# Patient Record
Sex: Female | Born: 1945 | Race: White | Hispanic: No | Marital: Single | Smoking: Never smoker
Health system: Southern US, Community
[De-identification: ages and names within clinical notes are randomized; demographics above are authoritative.]

## PROBLEM LIST (undated history)

## (undated) DIAGNOSIS — M1990 Unspecified osteoarthritis, unspecified site: Secondary | ICD-10-CM

## (undated) DIAGNOSIS — R112 Nausea with vomiting, unspecified: Secondary | ICD-10-CM

## (undated) DIAGNOSIS — J189 Pneumonia, unspecified organism: Secondary | ICD-10-CM

## (undated) DIAGNOSIS — M542 Cervicalgia: Secondary | ICD-10-CM

## (undated) DIAGNOSIS — J40 Bronchitis, not specified as acute or chronic: Secondary | ICD-10-CM

## (undated) DIAGNOSIS — M549 Dorsalgia, unspecified: Secondary | ICD-10-CM

## (undated) DIAGNOSIS — IMO0001 Reserved for inherently not codable concepts without codable children: Secondary | ICD-10-CM

## (undated) DIAGNOSIS — C801 Malignant (primary) neoplasm, unspecified: Secondary | ICD-10-CM

## (undated) DIAGNOSIS — D649 Anemia, unspecified: Secondary | ICD-10-CM

## (undated) DIAGNOSIS — M25519 Pain in unspecified shoulder: Secondary | ICD-10-CM

## (undated) DIAGNOSIS — R252 Cramp and spasm: Secondary | ICD-10-CM

## (undated) DIAGNOSIS — N879 Dysplasia of cervix uteri, unspecified: Secondary | ICD-10-CM

## (undated) DIAGNOSIS — I8390 Asymptomatic varicose veins of unspecified lower extremity: Secondary | ICD-10-CM

## (undated) DIAGNOSIS — Z9889 Other specified postprocedural states: Secondary | ICD-10-CM

## (undated) HISTORY — PX: BREAST EXCISIONAL BIOPSY: SUR124

## (undated) HISTORY — PX: AUGMENTATION MAMMAPLASTY: SUR837

## (undated) HISTORY — PX: OTHER SURGICAL HISTORY: SHX169

## (undated) HISTORY — PX: BREAST SURGERY: SHX581

## (undated) HISTORY — PX: VARICOSE VEIN SURGERY: SHX832

## (undated) HISTORY — PX: CHOLECYSTECTOMY: SHX55

---

## 2013-03-06 DIAGNOSIS — R197 Diarrhea, unspecified: Secondary | ICD-10-CM | POA: Diagnosis not present

## 2013-03-06 DIAGNOSIS — K7689 Other specified diseases of liver: Secondary | ICD-10-CM | POA: Diagnosis not present

## 2013-03-06 DIAGNOSIS — R1110 Vomiting, unspecified: Secondary | ICD-10-CM | POA: Diagnosis not present

## 2013-03-06 DIAGNOSIS — R109 Unspecified abdominal pain: Secondary | ICD-10-CM | POA: Diagnosis not present

## 2013-03-06 DIAGNOSIS — K5289 Other specified noninfective gastroenteritis and colitis: Secondary | ICD-10-CM | POA: Diagnosis not present

## 2013-04-28 DIAGNOSIS — J029 Acute pharyngitis, unspecified: Secondary | ICD-10-CM | POA: Diagnosis not present

## 2013-05-07 DIAGNOSIS — L821 Other seborrheic keratosis: Secondary | ICD-10-CM | POA: Diagnosis not present

## 2013-05-07 DIAGNOSIS — L819 Disorder of pigmentation, unspecified: Secondary | ICD-10-CM | POA: Diagnosis not present

## 2013-05-07 DIAGNOSIS — D235 Other benign neoplasm of skin of trunk: Secondary | ICD-10-CM | POA: Diagnosis not present

## 2013-05-07 DIAGNOSIS — L578 Other skin changes due to chronic exposure to nonionizing radiation: Secondary | ICD-10-CM | POA: Diagnosis not present

## 2013-05-07 DIAGNOSIS — L820 Inflamed seborrheic keratosis: Secondary | ICD-10-CM | POA: Diagnosis not present

## 2013-05-07 DIAGNOSIS — D1801 Hemangioma of skin and subcutaneous tissue: Secondary | ICD-10-CM | POA: Diagnosis not present

## 2013-10-12 DIAGNOSIS — R1013 Epigastric pain: Secondary | ICD-10-CM | POA: Diagnosis not present

## 2013-10-12 DIAGNOSIS — K810 Acute cholecystitis: Secondary | ICD-10-CM | POA: Diagnosis not present

## 2013-10-12 DIAGNOSIS — I2510 Atherosclerotic heart disease of native coronary artery without angina pectoris: Secondary | ICD-10-CM | POA: Diagnosis not present

## 2013-10-12 DIAGNOSIS — R1011 Right upper quadrant pain: Secondary | ICD-10-CM | POA: Diagnosis not present

## 2013-10-12 DIAGNOSIS — R0609 Other forms of dyspnea: Secondary | ICD-10-CM | POA: Diagnosis not present

## 2013-10-12 DIAGNOSIS — K219 Gastro-esophageal reflux disease without esophagitis: Secondary | ICD-10-CM | POA: Diagnosis not present

## 2013-10-12 DIAGNOSIS — K824 Cholesterolosis of gallbladder: Secondary | ICD-10-CM | POA: Diagnosis not present

## 2013-10-12 DIAGNOSIS — K811 Chronic cholecystitis: Secondary | ICD-10-CM | POA: Diagnosis not present

## 2013-10-12 DIAGNOSIS — R109 Unspecified abdominal pain: Secondary | ICD-10-CM | POA: Diagnosis not present

## 2013-10-12 DIAGNOSIS — R112 Nausea with vomiting, unspecified: Secondary | ICD-10-CM | POA: Diagnosis not present

## 2013-10-12 DIAGNOSIS — K8020 Calculus of gallbladder without cholecystitis without obstruction: Secondary | ICD-10-CM | POA: Diagnosis not present

## 2013-10-12 DIAGNOSIS — K7689 Other specified diseases of liver: Secondary | ICD-10-CM | POA: Diagnosis not present

## 2013-10-12 DIAGNOSIS — K812 Acute cholecystitis with chronic cholecystitis: Secondary | ICD-10-CM | POA: Diagnosis not present

## 2013-10-13 DIAGNOSIS — K810 Acute cholecystitis: Secondary | ICD-10-CM | POA: Diagnosis not present

## 2013-10-27 DIAGNOSIS — M4600 Spinal enthesopathy, site unspecified: Secondary | ICD-10-CM | POA: Diagnosis not present

## 2013-10-27 DIAGNOSIS — M999 Biomechanical lesion, unspecified: Secondary | ICD-10-CM | POA: Diagnosis not present

## 2013-10-27 DIAGNOSIS — M9981 Other biomechanical lesions of cervical region: Secondary | ICD-10-CM | POA: Diagnosis not present

## 2013-10-27 DIAGNOSIS — M5412 Radiculopathy, cervical region: Secondary | ICD-10-CM | POA: Diagnosis not present

## 2013-10-30 DIAGNOSIS — M4600 Spinal enthesopathy, site unspecified: Secondary | ICD-10-CM | POA: Diagnosis not present

## 2013-10-30 DIAGNOSIS — M999 Biomechanical lesion, unspecified: Secondary | ICD-10-CM | POA: Diagnosis not present

## 2013-10-30 DIAGNOSIS — M9981 Other biomechanical lesions of cervical region: Secondary | ICD-10-CM | POA: Diagnosis not present

## 2013-10-30 DIAGNOSIS — M5412 Radiculopathy, cervical region: Secondary | ICD-10-CM | POA: Diagnosis not present

## 2013-11-02 DIAGNOSIS — M999 Biomechanical lesion, unspecified: Secondary | ICD-10-CM | POA: Diagnosis not present

## 2013-11-02 DIAGNOSIS — M5412 Radiculopathy, cervical region: Secondary | ICD-10-CM | POA: Diagnosis not present

## 2013-11-02 DIAGNOSIS — M9981 Other biomechanical lesions of cervical region: Secondary | ICD-10-CM | POA: Diagnosis not present

## 2013-11-02 DIAGNOSIS — M4600 Spinal enthesopathy, site unspecified: Secondary | ICD-10-CM | POA: Diagnosis not present

## 2013-11-06 DIAGNOSIS — M5412 Radiculopathy, cervical region: Secondary | ICD-10-CM | POA: Diagnosis not present

## 2013-11-06 DIAGNOSIS — M999 Biomechanical lesion, unspecified: Secondary | ICD-10-CM | POA: Diagnosis not present

## 2013-11-06 DIAGNOSIS — M4600 Spinal enthesopathy, site unspecified: Secondary | ICD-10-CM | POA: Diagnosis not present

## 2013-11-06 DIAGNOSIS — M9981 Other biomechanical lesions of cervical region: Secondary | ICD-10-CM | POA: Diagnosis not present

## 2014-01-12 DIAGNOSIS — K120 Recurrent oral aphthae: Secondary | ICD-10-CM | POA: Diagnosis not present

## 2014-01-12 DIAGNOSIS — J069 Acute upper respiratory infection, unspecified: Secondary | ICD-10-CM | POA: Diagnosis not present

## 2014-01-12 DIAGNOSIS — R05 Cough: Secondary | ICD-10-CM | POA: Diagnosis not present

## 2014-01-12 DIAGNOSIS — J029 Acute pharyngitis, unspecified: Secondary | ICD-10-CM | POA: Diagnosis not present

## 2014-04-05 DIAGNOSIS — J329 Chronic sinusitis, unspecified: Secondary | ICD-10-CM | POA: Diagnosis not present

## 2014-04-05 DIAGNOSIS — N390 Urinary tract infection, site not specified: Secondary | ICD-10-CM | POA: Diagnosis not present

## 2014-04-07 DIAGNOSIS — J9801 Acute bronchospasm: Secondary | ICD-10-CM | POA: Diagnosis not present

## 2014-04-07 DIAGNOSIS — R079 Chest pain, unspecified: Secondary | ICD-10-CM | POA: Diagnosis not present

## 2014-04-29 DIAGNOSIS — D229 Melanocytic nevi, unspecified: Secondary | ICD-10-CM | POA: Diagnosis not present

## 2014-04-29 DIAGNOSIS — D485 Neoplasm of uncertain behavior of skin: Secondary | ICD-10-CM | POA: Diagnosis not present

## 2014-04-29 DIAGNOSIS — L814 Other melanin hyperpigmentation: Secondary | ICD-10-CM | POA: Diagnosis not present

## 2014-04-29 DIAGNOSIS — L821 Other seborrheic keratosis: Secondary | ICD-10-CM | POA: Diagnosis not present

## 2014-04-29 DIAGNOSIS — D1801 Hemangioma of skin and subcutaneous tissue: Secondary | ICD-10-CM | POA: Diagnosis not present

## 2014-04-29 DIAGNOSIS — L820 Inflamed seborrheic keratosis: Secondary | ICD-10-CM | POA: Diagnosis not present

## 2014-05-17 DIAGNOSIS — R208 Other disturbances of skin sensation: Secondary | ICD-10-CM | POA: Diagnosis not present

## 2014-05-17 DIAGNOSIS — D2271 Melanocytic nevi of right lower limb, including hip: Secondary | ICD-10-CM | POA: Diagnosis not present

## 2014-05-17 DIAGNOSIS — L821 Other seborrheic keratosis: Secondary | ICD-10-CM | POA: Diagnosis not present

## 2014-06-06 DIAGNOSIS — N390 Urinary tract infection, site not specified: Secondary | ICD-10-CM | POA: Diagnosis not present

## 2014-06-08 DIAGNOSIS — N390 Urinary tract infection, site not specified: Secondary | ICD-10-CM | POA: Diagnosis not present

## 2014-08-27 DIAGNOSIS — R5383 Other fatigue: Secondary | ICD-10-CM | POA: Diagnosis not present

## 2014-08-27 DIAGNOSIS — R634 Abnormal weight loss: Secondary | ICD-10-CM | POA: Diagnosis not present

## 2014-08-27 DIAGNOSIS — R079 Chest pain, unspecified: Secondary | ICD-10-CM | POA: Diagnosis not present

## 2014-08-27 DIAGNOSIS — K5900 Constipation, unspecified: Secondary | ICD-10-CM | POA: Diagnosis not present

## 2014-09-07 DIAGNOSIS — R634 Abnormal weight loss: Secondary | ICD-10-CM | POA: Diagnosis not present

## 2014-09-13 DIAGNOSIS — R531 Weakness: Secondary | ICD-10-CM | POA: Diagnosis not present

## 2014-09-13 DIAGNOSIS — M6281 Muscle weakness (generalized): Secondary | ICD-10-CM | POA: Diagnosis not present

## 2014-09-13 DIAGNOSIS — R42 Dizziness and giddiness: Secondary | ICD-10-CM | POA: Diagnosis not present

## 2014-09-13 DIAGNOSIS — D649 Anemia, unspecified: Secondary | ICD-10-CM | POA: Diagnosis not present

## 2014-09-13 DIAGNOSIS — Z881 Allergy status to other antibiotic agents status: Secondary | ICD-10-CM | POA: Diagnosis not present

## 2014-09-13 DIAGNOSIS — D6489 Other specified anemias: Secondary | ICD-10-CM | POA: Diagnosis not present

## 2014-09-13 DIAGNOSIS — R4182 Altered mental status, unspecified: Secondary | ICD-10-CM | POA: Diagnosis not present

## 2014-09-13 DIAGNOSIS — R5382 Chronic fatigue, unspecified: Secondary | ICD-10-CM | POA: Diagnosis not present

## 2014-10-01 DIAGNOSIS — R5383 Other fatigue: Secondary | ICD-10-CM | POA: Diagnosis not present

## 2014-10-01 DIAGNOSIS — R718 Other abnormality of red blood cells: Secondary | ICD-10-CM | POA: Diagnosis not present

## 2014-10-01 DIAGNOSIS — D649 Anemia, unspecified: Secondary | ICD-10-CM | POA: Diagnosis not present

## 2014-10-08 DIAGNOSIS — R5383 Other fatigue: Secondary | ICD-10-CM | POA: Diagnosis not present

## 2014-10-12 DIAGNOSIS — R0789 Other chest pain: Secondary | ICD-10-CM | POA: Diagnosis not present

## 2014-10-12 DIAGNOSIS — R079 Chest pain, unspecified: Secondary | ICD-10-CM | POA: Diagnosis not present

## 2014-10-29 DIAGNOSIS — E2839 Other primary ovarian failure: Secondary | ICD-10-CM | POA: Diagnosis not present

## 2014-10-29 DIAGNOSIS — R202 Paresthesia of skin: Secondary | ICD-10-CM | POA: Diagnosis not present

## 2014-10-29 DIAGNOSIS — D649 Anemia, unspecified: Secondary | ICD-10-CM | POA: Diagnosis not present

## 2014-10-29 DIAGNOSIS — R252 Cramp and spasm: Secondary | ICD-10-CM | POA: Diagnosis not present

## 2014-10-29 DIAGNOSIS — Z1389 Encounter for screening for other disorder: Secondary | ICD-10-CM | POA: Diagnosis not present

## 2014-10-29 DIAGNOSIS — R5383 Other fatigue: Secondary | ICD-10-CM | POA: Diagnosis not present

## 2014-10-29 DIAGNOSIS — Z0000 Encounter for general adult medical examination without abnormal findings: Secondary | ICD-10-CM | POA: Diagnosis not present

## 2014-11-03 DIAGNOSIS — S335XXA Sprain of ligaments of lumbar spine, initial encounter: Secondary | ICD-10-CM | POA: Diagnosis not present

## 2014-11-03 DIAGNOSIS — M62838 Other muscle spasm: Secondary | ICD-10-CM | POA: Diagnosis not present

## 2014-11-15 DIAGNOSIS — M545 Low back pain: Secondary | ICD-10-CM | POA: Diagnosis not present

## 2014-11-15 DIAGNOSIS — M542 Cervicalgia: Secondary | ICD-10-CM | POA: Diagnosis not present

## 2014-11-17 ENCOUNTER — Other Ambulatory Visit: Payer: Self-pay | Admitting: Gastroenterology

## 2014-11-19 DIAGNOSIS — M542 Cervicalgia: Secondary | ICD-10-CM | POA: Diagnosis not present

## 2014-11-19 DIAGNOSIS — M545 Low back pain: Secondary | ICD-10-CM | POA: Diagnosis not present

## 2014-11-24 DIAGNOSIS — M542 Cervicalgia: Secondary | ICD-10-CM | POA: Diagnosis not present

## 2014-11-24 DIAGNOSIS — M545 Low back pain: Secondary | ICD-10-CM | POA: Diagnosis not present

## 2014-11-26 DIAGNOSIS — M542 Cervicalgia: Secondary | ICD-10-CM | POA: Diagnosis not present

## 2014-11-26 DIAGNOSIS — M545 Low back pain: Secondary | ICD-10-CM | POA: Diagnosis not present

## 2014-11-29 DIAGNOSIS — M545 Low back pain: Secondary | ICD-10-CM | POA: Diagnosis not present

## 2014-11-29 DIAGNOSIS — M542 Cervicalgia: Secondary | ICD-10-CM | POA: Diagnosis not present

## 2014-12-01 DIAGNOSIS — M542 Cervicalgia: Secondary | ICD-10-CM | POA: Diagnosis not present

## 2014-12-01 DIAGNOSIS — M545 Low back pain: Secondary | ICD-10-CM | POA: Diagnosis not present

## 2014-12-06 DIAGNOSIS — M810 Age-related osteoporosis without current pathological fracture: Secondary | ICD-10-CM | POA: Diagnosis not present

## 2014-12-06 DIAGNOSIS — E2839 Other primary ovarian failure: Secondary | ICD-10-CM | POA: Diagnosis not present

## 2014-12-08 DIAGNOSIS — M542 Cervicalgia: Secondary | ICD-10-CM | POA: Diagnosis not present

## 2014-12-08 DIAGNOSIS — M545 Low back pain: Secondary | ICD-10-CM | POA: Diagnosis not present

## 2014-12-13 DIAGNOSIS — M545 Low back pain: Secondary | ICD-10-CM | POA: Diagnosis not present

## 2014-12-13 DIAGNOSIS — M542 Cervicalgia: Secondary | ICD-10-CM | POA: Diagnosis not present

## 2014-12-14 ENCOUNTER — Encounter (HOSPITAL_COMMUNITY): Payer: Self-pay | Admitting: *Deleted

## 2014-12-15 ENCOUNTER — Other Ambulatory Visit: Payer: Self-pay | Admitting: Gastroenterology

## 2014-12-17 DIAGNOSIS — M542 Cervicalgia: Secondary | ICD-10-CM | POA: Diagnosis not present

## 2014-12-17 DIAGNOSIS — M545 Low back pain: Secondary | ICD-10-CM | POA: Diagnosis not present

## 2014-12-20 ENCOUNTER — Emergency Department (HOSPITAL_COMMUNITY)
Admission: EM | Admit: 2014-12-20 | Discharge: 2014-12-20 | Disposition: A | Discharge status: Home / Self Care | Payer: Medicare Other | Attending: Emergency Medicine | Admitting: Emergency Medicine

## 2014-12-20 ENCOUNTER — Other Ambulatory Visit: Payer: Self-pay

## 2014-12-20 ENCOUNTER — Encounter (HOSPITAL_COMMUNITY): Payer: Self-pay

## 2014-12-20 DIAGNOSIS — Z880 Allergy status to penicillin: Secondary | ICD-10-CM | POA: Insufficient documentation

## 2014-12-20 DIAGNOSIS — Z79899 Other long term (current) drug therapy: Secondary | ICD-10-CM | POA: Insufficient documentation

## 2014-12-20 DIAGNOSIS — R109 Unspecified abdominal pain: Secondary | ICD-10-CM

## 2014-12-20 DIAGNOSIS — R61 Generalized hyperhidrosis: Secondary | ICD-10-CM | POA: Diagnosis not present

## 2014-12-20 DIAGNOSIS — R1013 Epigastric pain: Secondary | ICD-10-CM | POA: Diagnosis present

## 2014-12-20 DIAGNOSIS — R079 Chest pain, unspecified: Secondary | ICD-10-CM | POA: Diagnosis not present

## 2014-12-20 LAB — CBC WITH DIFFERENTIAL/PLATELET
Basophils Absolute: 0 K/uL (ref 0.0–0.1)
Basophils Relative: 0 %
Eosinophils Absolute: 0.1 K/uL (ref 0.0–0.7)
Eosinophils Relative: 2 %
HCT: 38.8 % (ref 36.0–46.0)
Hemoglobin: 12.8 g/dL (ref 12.0–15.0)
Lymphocytes Relative: 22 %
Lymphs Abs: 1.1 K/uL (ref 0.7–4.0)
MCH: 29.2 pg (ref 26.0–34.0)
MCHC: 33 g/dL (ref 30.0–36.0)
MCV: 88.4 fL (ref 78.0–100.0)
Monocytes Absolute: 0.6 K/uL (ref 0.1–1.0)
Monocytes Relative: 13 %
Neutro Abs: 3.1 K/uL (ref 1.7–7.7)
Neutrophils Relative %: 63 %
Platelets: 175 K/uL (ref 150–400)
RBC: 4.39 MIL/uL (ref 3.87–5.11)
RDW: 13.7 % (ref 11.5–15.5)
WBC: 4.9 K/uL (ref 4.0–10.5)

## 2014-12-20 LAB — COMPREHENSIVE METABOLIC PANEL WITH GFR
ALT: 14 U/L (ref 14–54)
AST: 25 U/L (ref 15–41)
Albumin: 3.8 g/dL (ref 3.5–5.0)
Alkaline Phosphatase: 61 U/L (ref 38–126)
Anion gap: 7 (ref 5–15)
BUN: 14 mg/dL (ref 6–20)
CO2: 26 mmol/L (ref 22–32)
Calcium: 8.6 mg/dL — ABNORMAL LOW (ref 8.9–10.3)
Chloride: 98 mmol/L — ABNORMAL LOW (ref 101–111)
Creatinine, Ser: 0.56 mg/dL (ref 0.44–1.00)
GFR calc Af Amer: >60 mL/min
GFR calc non Af Amer: >60 mL/min
Glucose, Bld: 105 mg/dL — ABNORMAL HIGH (ref 65–99)
Potassium: 3.8 mmol/L (ref 3.5–5.1)
Sodium: 131 mmol/L — ABNORMAL LOW (ref 135–145)
Total Bilirubin: 1 mg/dL (ref 0.3–1.2)
Total Protein: 6.3 g/dL — ABNORMAL LOW (ref 6.5–8.1)

## 2014-12-20 LAB — URINALYSIS, ROUTINE W REFLEX MICROSCOPIC
Bilirubin Urine: NEGATIVE
Glucose, UA: NEGATIVE mg/dL
Hgb urine dipstick: NEGATIVE
Ketones, ur: NEGATIVE mg/dL
Leukocytes, UA: NEGATIVE
Nitrite: NEGATIVE
Protein, ur: NEGATIVE mg/dL
Specific Gravity, Urine: 1.009 (ref 1.005–1.030)
Urobilinogen, UA: 1 mg/dL (ref 0.0–1.0)
pH: 7 (ref 5.0–8.0)

## 2014-12-20 LAB — LIPASE, BLOOD: Lipase: 27 U/L (ref 22–51)

## 2014-12-20 NOTE — Discharge Instructions (Signed)
Abdominal Pain, Adult Many things can cause abdominal pain. Usually, abdominal pain is not caused by a disease and will improve without treatment. It can often be observed and treated at home. Your health care provider will do a physical exam and possibly order blood tests and X-rays to help determine the seriousness of your pain. However, in many cases, more time must pass before a clear cause of the pain can be found. Before that point, your health care provider may not know if you need more testing or further treatment. HOME CARE INSTRUCTIONS Monitor your abdominal pain for any changes. The following actions may help to alleviate any discomfort you are experiencing:  Only take over-the-counter or prescription medicines as directed by your health care provider.  Do not take laxatives unless directed to do so by your health care provider.  Try a clear liquid diet (broth, tea, or water) as directed by your health care provider. Slowly move to a bland diet as tolerated. SEEK MEDICAL CARE IF:  You have unexplained abdominal pain.  You have abdominal pain associated with nausea or diarrhea.  You have pain when you urinate or have a bowel movement.  You experience abdominal pain that wakes you in the night.  You have abdominal pain that is worsened or improved by eating food.  You have abdominal pain that is worsened with eating fatty foods.  You have a fever. SEEK IMMEDIATE MEDICAL CARE IF:  Your pain does not go away within 2 hours.  You keep throwing up (vomiting).  Your pain is felt only in portions of the abdomen, such as the right side or the left lower portion of the abdomen.  You pass bloody or black tarry stools. MAKE SURE YOU:  Understand these instructions.  Will watch your condition.  Will get help right away if you are not doing well or get worse.   This information is not intended to replace advice given to you by your health care provider. Make sure you discuss  any questions you have with your health care provider.    DO NOT FINISH BOWEL PREP TONIGHT. FOLLOW UP WITH GI DOCTOR IN THE MORNING. RETURN TO THE EMERGENCY DEPARTMENT IF YOU HAVE WORSENING OF YOUR SYMPTOMS, VOMITING, FEVER, BLOOD IN YOUR STOOL.

## 2014-12-20 NOTE — ED Notes (Signed)
Per EMS: Pt scheduled to have colonoscopy tomorrow. Began bowel prep this afternoon. After second glass of bowel prep, began experiencing epigastric and abdominal pain. 10/10. Pt a/o at this time. Denies any vomiting or LOC. CBG = 90, BP = 120/78, 87 BPM.

## 2014-12-21 ENCOUNTER — Encounter (HOSPITAL_COMMUNITY): Payer: Self-pay | Admitting: Anesthesiology

## 2014-12-21 ENCOUNTER — Ambulatory Visit (HOSPITAL_COMMUNITY): Admission: RE | Admit: 2014-12-21 | Payer: Medicare Other | Source: Ambulatory Visit | Admitting: Gastroenterology

## 2014-12-21 ENCOUNTER — Other Ambulatory Visit: Payer: Self-pay | Admitting: Gastroenterology

## 2014-12-21 SURGERY — COLONOSCOPY WITH PROPOFOL
Anesthesia: Monitor Anesthesia Care

## 2014-12-21 MED ORDER — EPHEDRINE SULFATE 50 MG/ML IJ SOLN
INTRAMUSCULAR | Status: AC
  Filled 2014-12-21: qty 1

## 2014-12-21 MED ORDER — PROPOFOL 10 MG/ML IV BOLUS
INTRAVENOUS | Status: AC
  Filled 2014-12-21: qty 20

## 2014-12-21 MED ORDER — ONDANSETRON HCL 4 MG/2ML IJ SOLN
INTRAMUSCULAR | Status: AC
  Filled 2014-12-21: qty 4

## 2014-12-21 MED ORDER — SODIUM CHLORIDE 0.9 % IJ SOLN
INTRAMUSCULAR | Status: AC
  Filled 2014-12-21: qty 10

## 2014-12-22 NOTE — ED Provider Notes (Signed)
CSN: [id number]     Arrival date & time 12/20/14  1826 History   First MD Initiated Contact with Patient 12/20/14 1830     Chief Complaint  Patient presents with  . Abdominal Pain     (Consider location/radiation/quality/duration/timing/severity/associated sxs/prior Treatment) HPI   Brittany Valenzuela is a 69 y.o F with no significant pmhx who presents to the ED today c/o abdominal pain. Pt states that she began her bowel prep for a routine colonoscopy scheduled tomorrow morning. Pt states that she drank 1 glass of her bowel prep when she began to experience sudden onset, severe abdominal pain located in the epigastrium. Pain does not radiate. Pt states that she became very diaphoretic with the pain which lasted 10 minutes before resolving. In the ED pt is symptoms free. Denies vomiting, diarrhea, fever, CP, SOB, syncope.  History reviewed. No pertinent past medical history. Past Surgical History  Procedure Laterality Date  . Cholecystectomy     History reviewed. No pertinent family history. Social History  Substance Use Topics  . Smoking status: Never Smoker   . Smokeless tobacco: None  . Alcohol Use: No   OB History    No data available     Review of Systems  All other systems reviewed and are negative.     Allergies  Anesthetics, amide and Penicillins  Home Medications   Prior to Admission medications   Medication Sig Start Date End Date Taking? Authorizing Provider  ferrous sulfate 325 (65 FE) MG tablet Take 325 mg by mouth daily with breakfast.    Historical Provider, MD  ibuprofen (ADVIL,MOTRIN) 200 MG tablet Take 400 mg by mouth every 6 (six) hours as needed for moderate pain.    Historical Provider, MD  Multiple Vitamin (MULTIVITAMIN WITH MINERALS) TABS tablet Take 1 tablet by mouth daily.    Historical Provider, MD   BP 119/59 mmHg  Pulse 71  Resp 23  SpO2 97% Physical Exam  Constitutional: She is oriented to person, place, and time. She appears  well-developed and well-nourished. No distress.  HENT:  Head: Normocephalic and atraumatic.  Mouth/Throat: No oropharyngeal exudate.  Eyes: Conjunctivae and EOM are normal. Pupils are equal, round, and reactive to light. Right eye exhibits no discharge. Left eye exhibits no discharge. No scleral icterus.  Neck: Normal range of motion. Neck supple.  Cardiovascular: Normal rate, regular rhythm, normal heart sounds and intact distal pulses.  Exam reveals no gallop and no friction rub.   No murmur heard. Pulmonary/Chest: Effort normal and breath sounds normal. No respiratory distress. She has no wheezes. She has no rales. She exhibits no tenderness.  Abdominal: Soft. Bowel sounds are normal. She exhibits no distension and no mass. There is no tenderness. There is no rebound and no guarding.  Musculoskeletal: Normal range of motion. She exhibits no edema.  Lymphadenopathy:    She has no cervical adenopathy.  Neurological: She is alert and oriented to person, place, and time. No cranial nerve deficit.  Strength 5/5 throughout. No sensory deficits.  No gait abnormality   Skin: Skin is warm and dry. No rash noted. She is not diaphoretic. No erythema. No pallor.  Psychiatric: She has a normal mood and affect. Her behavior is normal.  Nursing note and vitals reviewed.   ED Course  Procedures (including critical care time) Labs Review Labs Reviewed  COMPREHENSIVE METABOLIC PANEL - Abnormal; Notable for the following:    Sodium 131 (*)    Chloride 98 (*)    Glucose, Bld 105 (*)  Calcium 8.6 (*)    Total Protein 6.3 (*)    All other components within normal limits  CBC WITH DIFFERENTIAL/PLATELET  LIPASE, BLOOD  URINALYSIS, ROUTINE W REFLEX MICROSCOPIC (NOT AT [facility])    Imaging Review No results found. I have personally reviewed and evaluated these images and lab results as part of my medical decision-making.   EKG Interpretation None      MDM   Final diagnoses:  Abdominal pain,  unspecified abdominal location   Pt presents with sudden onset abdominal pain after initiating bowel prep for routine colonoscopy. Denies vomiting, melena, hematochezia. Abdominal pain lasted for approximately 10. Currently symptom free in ED.  All labs wnl. Pt able to tolerate fluids.No RUQ tenderness, low suspicion gall bladder etiology. This was an isolated incident directly after consuming bowel prep.Suspect this is a gastritis from bowel prep.   Lipase wnl. Do not suspect pancreatitis.    Recommend discontinuation of bowel prep and follow up with GI.   Patient was discussed with and seen by Dr. Lacinda Axon who agrees with the treatment plan.      [name], PA-C 12/22/14 [place], MD 12/24/14 [phone]

## 2014-12-27 DIAGNOSIS — M542 Cervicalgia: Secondary | ICD-10-CM | POA: Diagnosis not present

## 2014-12-27 DIAGNOSIS — M545 Low back pain: Secondary | ICD-10-CM | POA: Diagnosis not present

## 2014-12-29 DIAGNOSIS — M545 Low back pain: Secondary | ICD-10-CM | POA: Diagnosis not present

## 2014-12-29 DIAGNOSIS — M542 Cervicalgia: Secondary | ICD-10-CM | POA: Diagnosis not present

## 2015-01-05 DIAGNOSIS — M545 Low back pain: Secondary | ICD-10-CM | POA: Diagnosis not present

## 2015-01-05 DIAGNOSIS — M542 Cervicalgia: Secondary | ICD-10-CM | POA: Diagnosis not present

## 2015-01-13 DIAGNOSIS — I83893 Varicose veins of bilateral lower extremities with other complications: Secondary | ICD-10-CM | POA: Diagnosis not present

## 2015-01-13 DIAGNOSIS — I83813 Varicose veins of bilateral lower extremities with pain: Secondary | ICD-10-CM | POA: Diagnosis not present

## 2015-01-17 DIAGNOSIS — M545 Low back pain: Secondary | ICD-10-CM | POA: Diagnosis not present

## 2015-01-17 DIAGNOSIS — M542 Cervicalgia: Secondary | ICD-10-CM | POA: Diagnosis not present

## 2015-01-19 DIAGNOSIS — M542 Cervicalgia: Secondary | ICD-10-CM | POA: Diagnosis not present

## 2015-01-19 DIAGNOSIS — M545 Low back pain: Secondary | ICD-10-CM | POA: Diagnosis not present

## 2015-01-20 DIAGNOSIS — I83893 Varicose veins of bilateral lower extremities with other complications: Secondary | ICD-10-CM | POA: Diagnosis not present

## 2015-01-20 DIAGNOSIS — I83813 Varicose veins of bilateral lower extremities with pain: Secondary | ICD-10-CM | POA: Diagnosis not present

## 2015-01-24 DIAGNOSIS — M545 Low back pain: Secondary | ICD-10-CM | POA: Diagnosis not present

## 2015-01-24 DIAGNOSIS — M542 Cervicalgia: Secondary | ICD-10-CM | POA: Diagnosis not present

## 2015-01-31 DIAGNOSIS — M542 Cervicalgia: Secondary | ICD-10-CM | POA: Diagnosis not present

## 2015-01-31 DIAGNOSIS — M545 Low back pain: Secondary | ICD-10-CM | POA: Diagnosis not present

## 2015-02-07 DIAGNOSIS — M545 Low back pain: Secondary | ICD-10-CM | POA: Diagnosis not present

## 2015-02-07 DIAGNOSIS — M542 Cervicalgia: Secondary | ICD-10-CM | POA: Diagnosis not present

## 2015-02-09 DIAGNOSIS — M542 Cervicalgia: Secondary | ICD-10-CM | POA: Diagnosis not present

## 2015-02-09 DIAGNOSIS — M545 Low back pain: Secondary | ICD-10-CM | POA: Diagnosis not present

## 2015-03-09 ENCOUNTER — Encounter (HOSPITAL_COMMUNITY): Payer: Self-pay | Admitting: *Deleted

## 2015-03-10 DIAGNOSIS — I83813 Varicose veins of bilateral lower extremities with pain: Secondary | ICD-10-CM | POA: Diagnosis not present

## 2015-03-10 DIAGNOSIS — I83893 Varicose veins of bilateral lower extremities with other complications: Secondary | ICD-10-CM | POA: Diagnosis not present

## 2015-03-14 ENCOUNTER — Ambulatory Visit (HOSPITAL_COMMUNITY): Payer: Medicare Other | Admitting: Anesthesiology

## 2015-03-14 ENCOUNTER — Ambulatory Visit (HOSPITAL_COMMUNITY)
Admission: RE | Admit: 2015-03-14 | Discharge: 2015-03-14 | Disposition: A | Discharge status: Home / Self Care | Payer: Medicare Other | Source: Ambulatory Visit | Attending: Gastroenterology | Admitting: Gastroenterology

## 2015-03-14 ENCOUNTER — Encounter (HOSPITAL_COMMUNITY): Payer: Self-pay | Admitting: *Deleted

## 2015-03-14 ENCOUNTER — Encounter (HOSPITAL_COMMUNITY)
Admission: RE | Disposition: A | Discharge status: Home / Self Care | Payer: Self-pay | Source: Ambulatory Visit | Attending: Gastroenterology

## 2015-03-14 DIAGNOSIS — Z1211 Encounter for screening for malignant neoplasm of colon: Secondary | ICD-10-CM | POA: Diagnosis not present

## 2015-03-14 DIAGNOSIS — D649 Anemia, unspecified: Secondary | ICD-10-CM | POA: Diagnosis not present

## 2015-03-14 DIAGNOSIS — K2950 Unspecified chronic gastritis without bleeding: Secondary | ICD-10-CM | POA: Insufficient documentation

## 2015-03-14 DIAGNOSIS — D509 Iron deficiency anemia, unspecified: Secondary | ICD-10-CM | POA: Insufficient documentation

## 2015-03-14 HISTORY — DX: Reserved for inherently not codable concepts without codable children: IMO0001

## 2015-03-14 HISTORY — PX: COLONOSCOPY WITH PROPOFOL: SHX5780

## 2015-03-14 HISTORY — DX: Bronchitis, not specified as acute or chronic: J40

## 2015-03-14 HISTORY — DX: Anemia, unspecified: D64.9

## 2015-03-14 HISTORY — PX: ESOPHAGOGASTRODUODENOSCOPY (EGD) WITH PROPOFOL: SHX5813

## 2015-03-14 HISTORY — DX: Pneumonia, unspecified organism: J18.9

## 2015-03-14 SURGERY — COLONOSCOPY WITH PROPOFOL
Anesthesia: Monitor Anesthesia Care

## 2015-03-14 MED ORDER — LACTATED RINGERS IV SOLN
INTRAVENOUS | Status: DC
  Administered 2015-03-14: 1000 mL via INTRAVENOUS

## 2015-03-14 MED ORDER — PROPOFOL 500 MG/50ML IV EMUL
INTRAVENOUS | Status: DC | PRN
  Administered 2015-03-14 (×4): 20 mg via INTRAVENOUS
  Administered 2015-03-14: 40 mg via INTRAVENOUS
  Administered 2015-03-14: 20 mg via INTRAVENOUS

## 2015-03-14 MED ORDER — PROPOFOL 500 MG/50ML IV EMUL
INTRAVENOUS | Status: DC | PRN
  Administered 2015-03-14: 100 ug/kg/min via INTRAVENOUS

## 2015-03-14 MED ORDER — LIDOCAINE HCL (CARDIAC) 20 MG/ML IV SOLN
INTRAVENOUS | Status: AC
Start: 2015-03-14 — End: 2015-03-14
  Filled 2015-03-14: qty 5

## 2015-03-14 MED ORDER — SODIUM CHLORIDE 0.9 % IV SOLN: INTRAVENOUS | Status: DC

## 2015-03-14 MED ORDER — PROPOFOL 10 MG/ML IV BOLUS
INTRAVENOUS | Status: AC
  Filled 2015-03-14: qty 40

## 2015-03-14 MED ORDER — PROPOFOL 10 MG/ML IV BOLUS
INTRAVENOUS | Status: AC
  Filled 2015-03-14: qty 20

## 2015-03-14 SURGICAL SUPPLY — 24 items

## 2015-03-14 NOTE — Op Note (Signed)
Problem: Unexplained iron deficiency anemia on aspirin 81 mg daily. Low hemoglobin, low serum iron saturation, normal serum ferritin (50.7 ng/mL). Negative stool screen for blood on 3 specimens. Normal vitamin B-12 level. No family history of colon cancer. Hemoglobin normalized on oral iron therapy.  Endoscopist: Earle Gell  Premedication: Propofol administered by anesthesia  Procedure: Diagnostic esophagogastroduodenoscopy The patient was placed in the left lateral decubitus position. The Pentax gastroscope was passed through the posterior hypopharynx into the proximal esophagus without difficulty. The hypopharynx, larynx, and vocal cords appeared normal.  Esophagoscopy: The proximal, mid, and lower segments of the esophageal mucosa appears normal. The squamocolumnar junction was regular in appearance and noted at approximately 40 cm from the incisor teeth.  Gastroscopy: Retroflex view of the gastric cardia and fundus was normal. The gastric body, antrum, and pylorus appeared normal. Random biopsies were performed to look for H.pylori gastritis.  Duodenoscopy: The duodenal bulb and descending duodenum appeared normal. Four biopsies were performed from the second portion of the duodenum and a biopsy was performed from the distal duodenal bulb to look for signs of villous atrophy associated with celiac disease.  Assessment: Normal esophagogastroduodenoscopy. Screen for H. pylori gastritis and celiac disease pending  Procedure: Screening colonoscopy Anal inspection and digital rectal exam were normal. The Pentax pediatric colonoscope was introduced into the rectum and advanced to the cecum. A normal-appearing appendiceal orifice and ileocecal valve were identified. Colonic preparation for the exam was satisfactory after vigorous water lavage of the colonic mucosa. Withdrawal time was 21 minutes  Rectum. Normal. Retroflexed view of the distal rectum was normal  Sigmoid colon and descending  colon. Normal  Splenic flexure. Normal  Transverse colon. Normal  Hepatic flexure. Normal  Ascending colon. Normal  Cecum and ileocecal valve. Normal  Assessment: Normal screening colonoscopy.  Recommendation: Remain off aspirin

## 2015-03-14 NOTE — H&P (Signed)
  Problem: Unexplained iron deficiency anemia based on a low hemoglobin, low serum iron saturation, and normal serum ferritin. Negative stool screen for blood on 3 specimens. Normal vitamin B-12 level. No family history of colon cancer. Hemoglobin normalized on oral iron therapy.  History: The patient is a 70 year old female born 04-Jan-1946. She has never undergone screening colonoscopy for colorectal neoplasia in the past.  On 08/27/2014 she was diagnosed with iron deficiency anemia unassociated with the use of chronic nonsteroidal anti-inflammatory medication. She does not donate blood to the [employer]. She received oral iron therapy with normalization of her hemoglobin.  She denies hemoptysis, hematuria, or gastrointestinal bleeding. She underwent a cholecystectomy 1 year ago.  The patient is scheduled to undergo diagnostic esophagogastroduodenoscopy and colonoscopy to evaluate unexplained iron deficiency anemia.  Medication allergies: Sulfa drugs. Penicillin. Doxycycline.  Past medical history: Laparoscopic cholecystectomy for gallstones.  Exam: The patient is alert and lying comfortably on the endoscopy stretcher. Abdomen is soft and nontender to palpation. Cardiac exam reveals a regular rhythm. Lungs are clear to auscultation.  Plan: Proceed with diagnostic esophagogastroduodenoscopy and colonoscopy to evaluate iron deficiency anemia which corrected on oral iron therapy.

## 2015-03-14 NOTE — Anesthesia Preprocedure Evaluation (Addendum)

## 2015-03-14 NOTE — Transfer of Care (Signed)
Immediate Anesthesia Transfer of Care Note  Patient: Brittany Valenzuela  Procedure(s) Performed: Procedure(s): COLONOSCOPY WITH PROPOFOL (N/A) ESOPHAGOGASTRODUODENOSCOPY (EGD) WITH PROPOFOL (N/A)  Patient Location: PACU  Anesthesia Type:MAC  Level of Consciousness: awake, alert  and oriented  Airway & Oxygen Therapy: Patient Spontanous Breathing and Patient connected to nasal cannula oxygen  Post-op Assessment: Report given to RN and Post -op Vital signs reviewed and stable  Post vital signs: Reviewed and stable  Last Vitals:  Filed Vitals:   03/14/15 0757  BP: 119/47  Temp: 36.7 C  Resp: 14    Complications: No apparent anesthesia complications

## 2015-03-14 NOTE — Discharge Instructions (Signed)
Colonoscopy, Care After °Refer to this sheet in the next few weeks. These instructions provide you with information on caring for yourself after your procedure. Your health care provider may also give you more specific instructions. Your treatment has been planned according to current medical practices, but problems sometimes occur. Call your health care provider if you have any problems or questions after your procedure. °WHAT TO EXPECT AFTER THE PROCEDURE  °After your procedure, it is typical to have the following: °· A small amount of blood in your stool. °· Moderate amounts of gas and mild abdominal cramping or bloating. °HOME CARE INSTRUCTIONS °· Do not drive, operate machinery, or sign important documents for 24 hours. °· You may shower and resume your regular physical activities, but move at a slower pace for the first 24 hours. °· Take frequent rest periods for the first 24 hours. °· Walk around or put a warm pack on your abdomen to help reduce abdominal cramping and bloating. °· Drink enough fluids to keep your urine clear or pale yellow. °· You may resume your normal diet as instructed by your health care provider. Avoid heavy or fried foods that are hard to digest. °· Avoid drinking alcohol for 24 hours or as instructed by your health care provider. °· Only take over-the-counter or prescription medicines as directed by your health care provider. °· If a tissue sample (biopsy) was taken during your procedure: °¨ Do not take aspirin or blood thinners for 7 days, or as instructed by your health care provider. °¨ Do not drink alcohol for 7 days, or as instructed by your health care provider. °¨ Eat soft foods for the first 24 hours. °SEEK MEDICAL CARE IF: °You have persistent spotting of blood in your stool 2-3 days after the procedure. °SEEK IMMEDIATE MEDICAL CARE IF: °· You have more than a small spotting of blood in your stool. °· You pass large blood clots in your stool. °· Your abdomen is swollen  (distended). °· You have nausea or vomiting. °· You have a fever. °· You have increasing abdominal pain that is not relieved with medicine. °  °This information is not intended to replace advice given to you by your health care provider. Make sure you discuss any questions you have with your health care provider. °  °Document Released: 10/04/2003 Document Revised: 12/10/2012 Document Reviewed: 10/27/2012 °Elsevier Interactive Patient Education ©2016 [employer] ° °

## 2015-03-14 NOTE — Anesthesia Postprocedure Evaluation (Signed)
Anesthesia Post Note  Patient: Brittany Valenzuela  Procedure(s) Performed: Procedure(s) (LRB): COLONOSCOPY WITH PROPOFOL (N/A) ESOPHAGOGASTRODUODENOSCOPY (EGD) WITH PROPOFOL (N/A)  Patient location during evaluation: PACU Anesthesia Type: MAC Level of consciousness: awake and alert Pain management: pain level controlled Vital Signs Assessment: post-procedure vital signs reviewed and stable Respiratory status: spontaneous breathing, nonlabored ventilation, respiratory function stable and patient connected to nasal cannula oxygen Cardiovascular status: stable and blood pressure returned to baseline Anesthetic complications: no    Last Vitals:  Filed Vitals:   03/14/15 0932 03/14/15 0959  BP: 102/44 138/55  Temp: 36.4 C   Resp: 16 14    Last Pain: There were no vitals filed for this visit.               Riccardo Dubin

## 2015-03-15 ENCOUNTER — Encounter (HOSPITAL_COMMUNITY): Payer: Self-pay | Admitting: Gastroenterology

## 2015-03-23 DIAGNOSIS — M545 Low back pain: Secondary | ICD-10-CM | POA: Diagnosis not present

## 2015-03-23 DIAGNOSIS — M542 Cervicalgia: Secondary | ICD-10-CM | POA: Diagnosis not present

## 2015-03-28 DIAGNOSIS — M542 Cervicalgia: Secondary | ICD-10-CM | POA: Diagnosis not present

## 2015-03-28 DIAGNOSIS — M545 Low back pain: Secondary | ICD-10-CM | POA: Diagnosis not present

## 2015-03-30 DIAGNOSIS — I8312 Varicose veins of left lower extremity with inflammation: Secondary | ICD-10-CM | POA: Diagnosis not present

## 2015-03-30 DIAGNOSIS — I83892 Varicose veins of left lower extremities with other complications: Secondary | ICD-10-CM | POA: Diagnosis not present

## 2015-04-01 DIAGNOSIS — I83892 Varicose veins of left lower extremities with other complications: Secondary | ICD-10-CM | POA: Diagnosis not present

## 2015-04-01 DIAGNOSIS — I8312 Varicose veins of left lower extremity with inflammation: Secondary | ICD-10-CM | POA: Diagnosis not present

## 2015-04-06 DIAGNOSIS — M542 Cervicalgia: Secondary | ICD-10-CM | POA: Diagnosis not present

## 2015-04-06 DIAGNOSIS — M545 Low back pain: Secondary | ICD-10-CM | POA: Diagnosis not present

## 2015-04-14 DIAGNOSIS — I83812 Varicose veins of left lower extremities with pain: Secondary | ICD-10-CM | POA: Diagnosis not present

## 2015-04-14 DIAGNOSIS — I8312 Varicose veins of left lower extremity with inflammation: Secondary | ICD-10-CM | POA: Diagnosis not present

## 2015-04-18 DIAGNOSIS — I83812 Varicose veins of left lower extremities with pain: Secondary | ICD-10-CM | POA: Diagnosis not present

## 2015-04-20 DIAGNOSIS — M542 Cervicalgia: Secondary | ICD-10-CM | POA: Diagnosis not present

## 2015-04-20 DIAGNOSIS — M545 Low back pain: Secondary | ICD-10-CM | POA: Diagnosis not present

## 2015-04-27 DIAGNOSIS — M542 Cervicalgia: Secondary | ICD-10-CM | POA: Diagnosis not present

## 2015-04-27 DIAGNOSIS — M545 Low back pain: Secondary | ICD-10-CM | POA: Diagnosis not present

## 2015-04-29 ENCOUNTER — Other Ambulatory Visit: Payer: Self-pay | Admitting: Sports Medicine

## 2015-04-29 DIAGNOSIS — M542 Cervicalgia: Secondary | ICD-10-CM

## 2015-04-29 DIAGNOSIS — S139XXA Sprain of joints and ligaments of unspecified parts of neck, initial encounter: Secondary | ICD-10-CM | POA: Diagnosis not present

## 2015-04-29 DIAGNOSIS — M545 Low back pain: Secondary | ICD-10-CM | POA: Diagnosis not present

## 2015-05-08 ENCOUNTER — Other Ambulatory Visit: Payer: Medicare Other

## 2015-05-15 ENCOUNTER — Ambulatory Visit
Admission: RE | Admit: 2015-05-15 | Discharge: 2015-05-15 | Disposition: A | Discharge status: Home / Self Care | Payer: Medicare Other | Source: Ambulatory Visit | Attending: Sports Medicine | Admitting: Sports Medicine

## 2015-05-15 DIAGNOSIS — M542 Cervicalgia: Secondary | ICD-10-CM

## 2015-05-15 DIAGNOSIS — M47812 Spondylosis without myelopathy or radiculopathy, cervical region: Secondary | ICD-10-CM | POA: Diagnosis not present

## 2015-05-15 DIAGNOSIS — M545 Low back pain: Secondary | ICD-10-CM

## 2015-05-15 DIAGNOSIS — M47816 Spondylosis without myelopathy or radiculopathy, lumbar region: Secondary | ICD-10-CM | POA: Diagnosis not present

## 2015-05-17 ENCOUNTER — Other Ambulatory Visit: Payer: Self-pay | Admitting: Sports Medicine

## 2015-05-17 DIAGNOSIS — M899 Disorder of bone, unspecified: Secondary | ICD-10-CM

## 2015-05-18 ENCOUNTER — Other Ambulatory Visit: Payer: Medicare Other

## 2015-05-18 DIAGNOSIS — Z01419 Encounter for gynecological examination (general) (routine) without abnormal findings: Secondary | ICD-10-CM | POA: Diagnosis not present

## 2015-05-18 DIAGNOSIS — Z6824 Body mass index (BMI) 24.0-24.9, adult: Secondary | ICD-10-CM | POA: Diagnosis not present

## 2015-05-18 DIAGNOSIS — M810 Age-related osteoporosis without current pathological fracture: Secondary | ICD-10-CM | POA: Diagnosis not present

## 2015-05-18 DIAGNOSIS — Z1231 Encounter for screening mammogram for malignant neoplasm of breast: Secondary | ICD-10-CM | POA: Diagnosis not present

## 2015-05-18 DIAGNOSIS — N814 Uterovaginal prolapse, unspecified: Secondary | ICD-10-CM | POA: Diagnosis not present

## 2015-05-18 DIAGNOSIS — Z124 Encounter for screening for malignant neoplasm of cervix: Secondary | ICD-10-CM | POA: Diagnosis not present

## 2015-05-30 DIAGNOSIS — M7981 Nontraumatic hematoma of soft tissue: Secondary | ICD-10-CM | POA: Diagnosis not present

## 2015-05-30 DIAGNOSIS — I83891 Varicose veins of right lower extremities with other complications: Secondary | ICD-10-CM | POA: Diagnosis not present

## 2015-05-30 DIAGNOSIS — I83811 Varicose veins of right lower extremities with pain: Secondary | ICD-10-CM | POA: Diagnosis not present

## 2015-06-01 DIAGNOSIS — I8311 Varicose veins of right lower extremity with inflammation: Secondary | ICD-10-CM | POA: Diagnosis not present

## 2015-06-09 ENCOUNTER — Ambulatory Visit
Admission: RE | Admit: 2015-06-09 | Discharge: 2015-06-09 | Disposition: A | Discharge status: Home / Self Care | Payer: Medicare Other | Source: Ambulatory Visit | Attending: Sports Medicine | Admitting: Sports Medicine

## 2015-06-09 DIAGNOSIS — M899 Disorder of bone, unspecified: Secondary | ICD-10-CM

## 2015-06-10 DIAGNOSIS — I83811 Varicose veins of right lower extremities with pain: Secondary | ICD-10-CM | POA: Diagnosis not present

## 2015-06-10 DIAGNOSIS — I8311 Varicose veins of right lower extremity with inflammation: Secondary | ICD-10-CM | POA: Diagnosis not present

## 2015-06-14 DIAGNOSIS — I8312 Varicose veins of left lower extremity with inflammation: Secondary | ICD-10-CM | POA: Diagnosis not present

## 2015-06-14 DIAGNOSIS — I83812 Varicose veins of left lower extremities with pain: Secondary | ICD-10-CM | POA: Diagnosis not present

## 2015-06-22 DIAGNOSIS — M545 Low back pain: Secondary | ICD-10-CM | POA: Diagnosis not present

## 2015-06-22 DIAGNOSIS — M542 Cervicalgia: Secondary | ICD-10-CM | POA: Diagnosis not present

## 2015-06-27 DIAGNOSIS — I8311 Varicose veins of right lower extremity with inflammation: Secondary | ICD-10-CM | POA: Diagnosis not present

## 2015-06-27 DIAGNOSIS — I83811 Varicose veins of right lower extremities with pain: Secondary | ICD-10-CM | POA: Diagnosis not present

## 2015-07-01 DIAGNOSIS — R319 Hematuria, unspecified: Secondary | ICD-10-CM | POA: Diagnosis not present

## 2015-07-01 DIAGNOSIS — Z01411 Encounter for gynecological examination (general) (routine) with abnormal findings: Secondary | ICD-10-CM | POA: Diagnosis not present

## 2015-07-01 DIAGNOSIS — N393 Stress incontinence (female) (male): Secondary | ICD-10-CM | POA: Diagnosis not present

## 2015-07-01 DIAGNOSIS — N814 Uterovaginal prolapse, unspecified: Secondary | ICD-10-CM | POA: Diagnosis not present

## 2015-07-04 DIAGNOSIS — N814 Uterovaginal prolapse, unspecified: Secondary | ICD-10-CM | POA: Diagnosis not present

## 2015-07-04 DIAGNOSIS — Z01411 Encounter for gynecological examination (general) (routine) with abnormal findings: Secondary | ICD-10-CM | POA: Diagnosis not present

## 2015-07-06 DIAGNOSIS — N814 Uterovaginal prolapse, unspecified: Secondary | ICD-10-CM | POA: Diagnosis not present

## 2015-07-06 DIAGNOSIS — Z01411 Encounter for gynecological examination (general) (routine) with abnormal findings: Secondary | ICD-10-CM | POA: Diagnosis not present

## 2015-07-06 DIAGNOSIS — M7981 Nontraumatic hematoma of soft tissue: Secondary | ICD-10-CM | POA: Diagnosis not present

## 2015-07-11 DIAGNOSIS — M542 Cervicalgia: Secondary | ICD-10-CM | POA: Diagnosis not present

## 2015-07-11 DIAGNOSIS — M545 Low back pain: Secondary | ICD-10-CM | POA: Diagnosis not present

## 2015-07-21 DIAGNOSIS — D229 Melanocytic nevi, unspecified: Secondary | ICD-10-CM | POA: Diagnosis not present

## 2015-07-21 DIAGNOSIS — D485 Neoplasm of uncertain behavior of skin: Secondary | ICD-10-CM | POA: Diagnosis not present

## 2015-07-21 DIAGNOSIS — D045 Carcinoma in situ of skin of trunk: Secondary | ICD-10-CM | POA: Diagnosis not present

## 2015-07-21 DIAGNOSIS — L814 Other melanin hyperpigmentation: Secondary | ICD-10-CM | POA: Diagnosis not present

## 2015-07-21 DIAGNOSIS — L821 Other seborrheic keratosis: Secondary | ICD-10-CM | POA: Diagnosis not present

## 2015-07-21 DIAGNOSIS — D1801 Hemangioma of skin and subcutaneous tissue: Secondary | ICD-10-CM | POA: Diagnosis not present

## 2015-08-12 DIAGNOSIS — M545 Low back pain: Secondary | ICD-10-CM | POA: Diagnosis not present

## 2015-08-29 ENCOUNTER — Other Ambulatory Visit: Payer: Self-pay | Admitting: Obstetrics and Gynecology

## 2015-09-14 NOTE — Patient Instructions (Signed)
Your procedure is scheduled on:  Wednesday, September 21, 2015  Enter through the [employer] [facility] at:  7:00 AM  Pick up the phone at the desk and dial [phone].  Call this number if you have problems the morning of surgery: [phone].  Remember: Do NOT eat food or drink after: Midnight Tuesday  Take these medicines the morning of surgery with a SIP OF WATER:  None  Do NOT wear jewelry (body piercing), metal hair clips/bobby pins, make-up, or nail polish. Do NOT wear lotions, powders, or perfumes.  You may wear deodorant. Do NOT shave for 48 hours prior to surgery. Do NOT bring valuables to the hospital. Contacts, dentures, or bridgework may not be worn into surgery.  Leave suitcase in car.  After surgery it may be brought to your room.  For patients admitted to the hospital, checkout time is 11:00 AM the day of discharge.

## 2015-09-15 ENCOUNTER — Encounter (HOSPITAL_COMMUNITY)
Admission: RE | Admit: 2015-09-15 | Discharge: 2015-09-15 | Disposition: A | Discharge status: Home / Self Care | Payer: Medicare Other | Source: Ambulatory Visit | Attending: Obstetrics and Gynecology | Admitting: Obstetrics and Gynecology

## 2015-09-15 ENCOUNTER — Encounter (HOSPITAL_COMMUNITY): Payer: Self-pay

## 2015-09-15 DIAGNOSIS — Z01812 Encounter for preprocedural laboratory examination: Secondary | ICD-10-CM | POA: Insufficient documentation

## 2015-09-15 DIAGNOSIS — M1990 Unspecified osteoarthritis, unspecified site: Secondary | ICD-10-CM | POA: Diagnosis not present

## 2015-09-15 DIAGNOSIS — D649 Anemia, unspecified: Secondary | ICD-10-CM | POA: Diagnosis not present

## 2015-09-15 DIAGNOSIS — I868 Varicose veins of other specified sites: Secondary | ICD-10-CM | POA: Diagnosis not present

## 2015-09-15 HISTORY — DX: Cervicalgia: M54.2

## 2015-09-15 HISTORY — DX: Unspecified osteoarthritis, unspecified site: M19.90

## 2015-09-15 HISTORY — DX: Dysplasia of cervix uteri, unspecified: N87.9

## 2015-09-15 HISTORY — DX: Cramp and spasm: R25.2

## 2015-09-15 HISTORY — DX: Dorsalgia, unspecified: M54.9

## 2015-09-15 HISTORY — DX: Other specified postprocedural states: Z98.890

## 2015-09-15 HISTORY — DX: Asymptomatic varicose veins of unspecified lower extremity: I83.90

## 2015-09-15 HISTORY — DX: Nausea with vomiting, unspecified: R11.2

## 2015-09-15 HISTORY — DX: Malignant (primary) neoplasm, unspecified: C80.1

## 2015-09-15 HISTORY — DX: Pain in unspecified shoulder: M25.519

## 2015-09-15 LAB — TYPE AND SCREEN
ABO/RH(D): A NEG
Antibody Screen: NEGATIVE

## 2015-09-15 LAB — CBC
HEMATOCRIT: 34.5 % — AB (ref 36.0–46.0)
Hemoglobin: 12.2 g/dL (ref 12.0–15.0)
MCH: 38.7 pg — AB (ref 26.0–34.0)
MCHC: 35.4 g/dL (ref 30.0–36.0)
MCV: 109.5 fL — AB (ref 78.0–100.0)
PLATELETS: 210 10*3/uL (ref 150–400)
RBC: 3.15 MIL/uL — AB (ref 3.87–5.11)
RDW: 14.7 % (ref 11.5–15.5)
WBC: 4.7 10*3/uL (ref 4.0–10.5)

## 2015-09-15 LAB — BASIC METABOLIC PANEL
Anion gap: 5 (ref 5–15)
BUN: 25 mg/dL — AB (ref 6–20)
CHLORIDE: 103 mmol/L (ref 101–111)
CO2: 29 mmol/L (ref 22–32)
CREATININE: 0.57 mg/dL (ref 0.44–1.00)
Calcium: 9.1 mg/dL (ref 8.9–10.3)
GFR calc Af Amer: >60 mL/min (ref >60)
GFR calc non Af Amer: >60 mL/min (ref >60)
Glucose, Bld: 89 mg/dL (ref 65–99)
POTASSIUM: 4.5 mmol/L (ref 3.5–5.1)
Sodium: 137 mmol/L (ref 135–145)

## 2015-09-15 LAB — ABO/RH: ABO/RH(D): A NEG

## 2015-09-19 MED ORDER — SCOPOLAMINE 1 MG/3DAYS TD PT72
1.0000 | MEDICATED_PATCH | Freq: Once | TRANSDERMAL | Status: DC
Start: 2015-09-19 — End: 2015-09-19

## 2015-09-20 MED ORDER — CIPROFLOXACIN IN D5W 400 MG/200ML IV SOLN
400.0000 mg | INTRAVENOUS | Status: AC
  Administered 2015-09-21: 400 mg via INTRAVENOUS
  Filled 2015-09-20: qty 200

## 2015-09-20 MED ORDER — CLINDAMYCIN PHOSPHATE 900 MG/50ML IV SOLN
900.0000 mg | INTRAVENOUS | Status: AC
  Administered 2015-09-21: 900 mg via INTRAVENOUS
  Filled 2015-09-20: qty 50
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
## 2015-09-21 ENCOUNTER — Inpatient Hospital Stay (HOSPITAL_COMMUNITY)
Admission: RE | Admit: 2015-09-21 | Discharge: 2015-09-24 | DRG: 743 | Disposition: A | Discharge status: Home / Self Care | Payer: Medicare Other | Source: Ambulatory Visit | Attending: Obstetrics and Gynecology | Admitting: Obstetrics and Gynecology

## 2015-09-21 ENCOUNTER — Ambulatory Visit (HOSPITAL_COMMUNITY): Payer: Medicare Other | Admitting: Anesthesiology

## 2015-09-21 ENCOUNTER — Encounter (HOSPITAL_COMMUNITY)
Admission: RE | Disposition: A | Discharge status: Home / Self Care | Payer: Self-pay | Source: Ambulatory Visit | Attending: Obstetrics and Gynecology

## 2015-09-21 ENCOUNTER — Encounter (HOSPITAL_COMMUNITY): Payer: Self-pay | Admitting: Anesthesiology

## 2015-09-21 DIAGNOSIS — Z833 Family history of diabetes mellitus: Secondary | ICD-10-CM

## 2015-09-21 DIAGNOSIS — N813 Complete uterovaginal prolapse: Secondary | ICD-10-CM | POA: Diagnosis not present

## 2015-09-21 DIAGNOSIS — N814 Uterovaginal prolapse, unspecified: Secondary | ICD-10-CM | POA: Diagnosis not present

## 2015-09-21 DIAGNOSIS — Z882 Allergy status to sulfonamides status: Secondary | ICD-10-CM

## 2015-09-21 DIAGNOSIS — M542 Cervicalgia: Secondary | ICD-10-CM | POA: Diagnosis not present

## 2015-09-21 DIAGNOSIS — N819 Female genital prolapse, unspecified: Secondary | ICD-10-CM | POA: Diagnosis present

## 2015-09-21 DIAGNOSIS — Z885 Allergy status to narcotic agent status: Secondary | ICD-10-CM | POA: Diagnosis not present

## 2015-09-21 DIAGNOSIS — Z8249 Family history of ischemic heart disease and other diseases of the circulatory system: Secondary | ICD-10-CM | POA: Diagnosis not present

## 2015-09-21 DIAGNOSIS — N8189 Other female genital prolapse: Secondary | ICD-10-CM | POA: Diagnosis not present

## 2015-09-21 DIAGNOSIS — N393 Stress incontinence (female) (male): Secondary | ICD-10-CM | POA: Diagnosis present

## 2015-09-21 DIAGNOSIS — N72 Inflammatory disease of cervix uteri: Secondary | ICD-10-CM | POA: Diagnosis not present

## 2015-09-21 DIAGNOSIS — Z880 Allergy status to penicillin: Secondary | ICD-10-CM

## 2015-09-21 DIAGNOSIS — M1990 Unspecified osteoarthritis, unspecified site: Secondary | ICD-10-CM | POA: Diagnosis not present

## 2015-09-21 DIAGNOSIS — M549 Dorsalgia, unspecified: Secondary | ICD-10-CM | POA: Diagnosis not present

## 2015-09-21 DIAGNOSIS — Z87410 Personal history of cervical dysplasia: Secondary | ICD-10-CM | POA: Diagnosis not present

## 2015-09-21 HISTORY — PX: BLADDER SUSPENSION: SHX72

## 2015-09-21 HISTORY — PX: ANTERIOR AND POSTERIOR REPAIR: SHX5121

## 2015-09-21 HISTORY — PX: VAGINAL HYSTERECTOMY: SHX2639

## 2015-09-21 HISTORY — PX: CYSTOSCOPY: SHX5120

## 2015-09-21 SURGERY — HYSTERECTOMY, VAGINAL
Anesthesia: General | Site: Vagina

## 2015-09-21 MED ORDER — VASOPRESSIN 20 UNIT/ML IV SOLN
INTRAVENOUS | Status: AC
  Filled 2015-09-21: qty 1

## 2015-09-21 MED ORDER — PROMETHAZINE HCL 25 MG/ML IJ SOLN
6.2500 mg | INTRAMUSCULAR | Status: DC | PRN
  Administered 2015-09-21: 6.25 mg via INTRAVENOUS

## 2015-09-21 MED ORDER — LIDOCAINE HCL (CARDIAC) 20 MG/ML IV SOLN
INTRAVENOUS | Status: DC | PRN
Start: 2015-09-21 — End: 2015-09-21
  Administered 2015-09-21: 100 mg via INTRAVENOUS

## 2015-09-21 MED ORDER — LACTATED RINGERS IV SOLN
INTRAVENOUS | Status: DC
  Administered 2015-09-21 (×3): via INTRAVENOUS

## 2015-09-21 MED ORDER — DEXAMETHASONE SODIUM PHOSPHATE 10 MG/ML IJ SOLN
INTRAMUSCULAR | Status: AC
  Filled 2015-09-21: qty 1

## 2015-09-21 MED ORDER — HYDROMORPHONE HCL 1 MG/ML IJ SOLN
INTRAMUSCULAR | Status: AC
  Filled 2015-09-21: qty 1

## 2015-09-21 MED ORDER — IBUPROFEN 600 MG PO TABS
600.0000 mg | ORAL_TABLET | Freq: Four times a day (QID) | ORAL | Status: DC | PRN
  Administered 2015-09-22 – 2015-09-23 (×3): 600 mg via ORAL
  Filled 2015-09-21 (×4): qty 1

## 2015-09-21 MED ORDER — ESTRADIOL 0.1 MG/GM VA CREA
TOPICAL_CREAM | VAGINAL | Status: DC | PRN
Start: 2015-09-21 — End: 2015-09-21
  Administered 2015-09-21: 1 via VAGINAL

## 2015-09-21 MED ORDER — PNEUMOCOCCAL VAC POLYVALENT 25 MCG/0.5ML IJ INJ
0.5000 mL | INJECTION | INTRAMUSCULAR | Status: DC
  Filled 2015-09-21: qty 0.5

## 2015-09-21 MED ORDER — HYDROMORPHONE HCL 2 MG PO TABS
1.0000 mg | ORAL_TABLET | Freq: Four times a day (QID) | ORAL | Status: DC | PRN
  Administered 2015-09-21 – 2015-09-24 (×9): 1 mg via ORAL
  Filled 2015-09-21 (×9): qty 1

## 2015-09-21 MED ORDER — ONDANSETRON HCL 4 MG/2ML IJ SOLN
4.0000 mg | Freq: Four times a day (QID) | INTRAMUSCULAR | Status: DC | PRN
  Administered 2015-09-21: 4 mg via INTRAVENOUS
  Filled 2015-09-21: qty 2

## 2015-09-21 MED ORDER — ONDANSETRON HCL 4 MG/2ML IJ SOLN
INTRAMUSCULAR | Status: DC | PRN
  Administered 2015-09-21: 4 mg via INTRAVENOUS

## 2015-09-21 MED ORDER — MIDAZOLAM HCL 2 MG/2ML IJ SOLN
INTRAMUSCULAR | Status: AC
  Filled 2015-09-21: qty 2

## 2015-09-21 MED ORDER — STERILE WATER FOR IRRIGATION IR SOLN
Status: DC | PRN
  Administered 2015-09-21: 1000 mL via INTRAVESICAL

## 2015-09-21 MED ORDER — KETOROLAC TROMETHAMINE 30 MG/ML IJ SOLN
30.0000 mg | Freq: Four times a day (QID) | INTRAMUSCULAR | Status: DC
  Administered 2015-09-21 – 2015-09-22 (×3): 30 mg via INTRAVENOUS
  Filled 2015-09-21 (×3): qty 1

## 2015-09-21 MED ORDER — ONDANSETRON HCL 4 MG/2ML IJ SOLN
INTRAMUSCULAR | Status: AC
  Filled 2015-09-21: qty 2

## 2015-09-21 MED ORDER — HYDROMORPHONE HCL 1 MG/ML IJ SOLN
0.2500 mg | INTRAMUSCULAR | Status: DC | PRN
  Administered 2015-09-21: 0.5 mg via INTRAVENOUS

## 2015-09-21 MED ORDER — VASOPRESSIN 20 UNIT/ML IV SOLN
INTRAVENOUS | Status: DC | PRN
  Administered 2015-09-21: 20 mL via INTRAMUSCULAR
  Administered 2015-09-21: 6 mL via INTRAMUSCULAR
  Administered 2015-09-21: 7 mL via INTRAMUSCULAR
  Administered 2015-09-21: 10 mL via INTRAMUSCULAR

## 2015-09-21 MED ORDER — MENTHOL 3 MG MT LOZG: 1.0000 | LOZENGE | OROMUCOSAL | Status: DC | PRN

## 2015-09-21 MED ORDER — SIMETHICONE 80 MG PO CHEW: 80.0000 mg | CHEWABLE_TABLET | Freq: Four times a day (QID) | ORAL | Status: DC | PRN

## 2015-09-21 MED ORDER — HYDROCODONE-ACETAMINOPHEN 5-325 MG PO TABS
2.0000 | ORAL_TABLET | ORAL | Status: DC | PRN
  Administered 2015-09-21 – 2015-09-22 (×3): 1 via ORAL
  Filled 2015-09-21 (×3): qty 2

## 2015-09-21 MED ORDER — MIDAZOLAM HCL 5 MG/5ML IJ SOLN
INTRAMUSCULAR | Status: DC | PRN
  Administered 2015-09-21: 2 mg via INTRAVENOUS

## 2015-09-21 MED ORDER — PROPOFOL 10 MG/ML IV BOLUS
INTRAVENOUS | Status: AC
  Filled 2015-09-21: qty 20

## 2015-09-21 MED ORDER — LIDOCAINE-EPINEPHRINE (PF) 1 %-1:200000 IJ SOLN
INTRAMUSCULAR | Status: AC
  Filled 2015-09-21: qty 30

## 2015-09-21 MED ORDER — SALINE SPRAY 0.65 % NA SOLN
1.0000 | NASAL | Status: DC | PRN
  Administered 2015-09-22: 1 via NASAL
  Filled 2015-09-21: qty 44

## 2015-09-21 MED ORDER — PROMETHAZINE HCL 25 MG/ML IJ SOLN
INTRAMUSCULAR | Status: AC
  Administered 2015-09-21: 6.25 mg via INTRAVENOUS
  Filled 2015-09-21: qty 1

## 2015-09-21 MED ORDER — MEPERIDINE HCL 25 MG/ML IJ SOLN: 6.2500 mg | INTRAMUSCULAR | Status: DC | PRN

## 2015-09-21 MED ORDER — KETOROLAC TROMETHAMINE 30 MG/ML IJ SOLN
INTRAMUSCULAR | Status: AC
  Filled 2015-09-21: qty 1

## 2015-09-21 MED ORDER — FENTANYL CITRATE (PF) 250 MCG/5ML IJ SOLN
INTRAMUSCULAR | Status: AC
  Filled 2015-09-21: qty 5

## 2015-09-21 MED ORDER — ONDANSETRON HCL 4 MG PO TABS: 4.0000 mg | ORAL_TABLET | Freq: Four times a day (QID) | ORAL | Status: DC | PRN

## 2015-09-21 MED ORDER — LIDOCAINE HCL (CARDIAC) 20 MG/ML IV SOLN
INTRAVENOUS | Status: AC
Start: 2015-09-21 — End: 2015-09-21
  Filled 2015-09-21: qty 5

## 2015-09-21 MED ORDER — ESTRADIOL 0.1 MG/GM VA CREA
TOPICAL_CREAM | VAGINAL | Status: AC
  Filled 2015-09-21: qty 42.5

## 2015-09-21 MED ORDER — EPHEDRINE SULFATE 50 MG/ML IJ SOLN
INTRAMUSCULAR | Status: DC | PRN
  Administered 2015-09-21 (×2): 10 mg via INTRAVENOUS

## 2015-09-21 MED ORDER — HYDROMORPHONE HCL 1 MG/ML IJ SOLN
INTRAMUSCULAR | Status: DC | PRN
  Administered 2015-09-21 (×2): 0.5 mg via INTRAVENOUS

## 2015-09-21 MED ORDER — DEXAMETHASONE SODIUM PHOSPHATE 4 MG/ML IJ SOLN
INTRAMUSCULAR | Status: DC | PRN
  Administered 2015-09-21: 10 mg via INTRAVENOUS

## 2015-09-21 MED ORDER — SODIUM CHLORIDE 0.9 % IJ SOLN
INTRAMUSCULAR | Status: AC
  Filled 2015-09-21: qty 100

## 2015-09-21 MED ORDER — FENTANYL CITRATE (PF) 100 MCG/2ML IJ SOLN
INTRAMUSCULAR | Status: DC | PRN
  Administered 2015-09-21: 100 ug via INTRAVENOUS
  Administered 2015-09-21 (×3): 50 ug via INTRAVENOUS

## 2015-09-21 MED ORDER — LACTATED RINGERS IV SOLN
INTRAVENOUS | Status: DC
  Administered 2015-09-21 – 2015-09-22 (×2): via INTRAVENOUS

## 2015-09-21 MED ORDER — EPHEDRINE 5 MG/ML INJ
INTRAVENOUS | Status: AC
  Filled 2015-09-21: qty 10

## 2015-09-21 MED ORDER — PROPOFOL 10 MG/ML IV BOLUS
INTRAVENOUS | Status: DC | PRN
  Administered 2015-09-21: 200 mg via INTRAVENOUS

## 2015-09-21 MED ORDER — HYDROMORPHONE HCL 1 MG/ML IJ SOLN
1.0000 mg | Freq: Once | INTRAMUSCULAR | Status: AC
  Administered 2015-09-21: 1 mg via INTRAVENOUS
  Filled 2015-09-21: qty 1

## 2015-09-21 MED ORDER — KETOROLAC TROMETHAMINE 30 MG/ML IJ SOLN: 30.0000 mg | Freq: Once | INTRAMUSCULAR | Status: DC

## 2015-09-21 MED ORDER — KETOROLAC TROMETHAMINE 30 MG/ML IJ SOLN
INTRAMUSCULAR | Status: DC | PRN
  Administered 2015-09-21: 30 mg via INTRAVENOUS

## 2015-09-21 SURGICAL SUPPLY — 50 items
BLADE SURG 11 STRL SS (BLADE) ×7 IMPLANT
BLADE SURG 15 STRL LF C SS BP (BLADE) ×5 IMPLANT
BLADE SURG 15 STRL SS (BLADE) ×2
CANISTER SUCT 3000ML (MISCELLANEOUS) ×14 IMPLANT
CATH FOLEY 2WAY SLVR  5CC 18FR (CATHETERS) ×4
CATH FOLEY 2WAY SLVR 5CC 18FR (CATHETERS) ×10 IMPLANT
CLOTH BEACON ORANGE TIMEOUT ST (SAFETY) ×7 IMPLANT
CONT PATH 16OZ SNAP LID 3702 (MISCELLANEOUS) ×7 IMPLANT
DECANTER SPIKE VIAL GLASS SM (MISCELLANEOUS) ×14 IMPLANT
DRAPE SHEET LG 3/4 BI-LAMINATE (DRAPES) ×14 IMPLANT
DRAPE STERI URO 9X17 APER PCH (DRAPES) ×7 IMPLANT
GAUZE PACKING 2X5 YD STRL (GAUZE/BANDAGES/DRESSINGS) ×7 IMPLANT
GAUZE SPONGE 4X4 16PLY XRAY LF (GAUZE/BANDAGES/DRESSINGS) ×14 IMPLANT
GLOVE BIO SURGEON STRL SZ7.5 (GLOVE) ×7 IMPLANT
GLOVE BIOGEL PI IND STRL 7.0 (GLOVE) ×15 IMPLANT
GLOVE BIOGEL PI IND STRL 7.5 (GLOVE) ×5 IMPLANT
GLOVE BIOGEL PI INDICATOR 7.0 (GLOVE) ×6
GLOVE BIOGEL PI INDICATOR 7.5 (GLOVE) ×2
GLOVE ECLIPSE 6.5 STRL STRAW (GLOVE) ×7 IMPLANT
GOWN STRL REUS W/TWL LRG LVL3 (GOWN DISPOSABLE) ×56 IMPLANT
LIQUID BAND (GAUZE/BANDAGES/DRESSINGS) ×7 IMPLANT
NEEDLE HYPO 22GX1.5 SAFETY (NEEDLE) ×7 IMPLANT
NEEDLE SPNL 20GX3.5 QUINCKE YW (NEEDLE) ×7 IMPLANT
NS IRRIG 1000ML POUR BTL (IV SOLUTION) ×7 IMPLANT
PACK VAGINAL WOMENS (CUSTOM PROCEDURE TRAY) ×7 IMPLANT
PAD OB MATERNITY 4.3X12.25 (PERSONAL CARE ITEMS) ×7 IMPLANT
SET CYSTO W/LG BORE CLAMP LF (SET/KITS/TRAYS/PACK) ×7 IMPLANT
SLING TRANS VAGINAL TAPE (Sling) ×2 IMPLANT
SLING UTERINE/ABD GYNECARE TVT (Sling) ×5 IMPLANT
SUT MNCRL AB 3-0 PS2 27 (SUTURE) IMPLANT
SUT MNCRL AB 4-0 PS2 18 (SUTURE) IMPLANT
SUT VIC AB 0 CT1 18XCR BRD8 (SUTURE) ×10 IMPLANT
SUT VIC AB 0 CT1 27 (SUTURE) ×4
SUT VIC AB 0 CT1 27XBRD ANBCTR (SUTURE) ×10 IMPLANT
SUT VIC AB 0 CT1 8-18 (SUTURE) ×4
SUT VIC AB 1 CT1 36 (SUTURE) IMPLANT
SUT VIC AB 2-0 CT1 27 (SUTURE) ×4
SUT VIC AB 2-0 CT1 TAPERPNT 27 (SUTURE) ×10 IMPLANT
SUT VIC AB 2-0 SH 27 (SUTURE) ×14
SUT VIC AB 2-0 SH 27XBRD (SUTURE) ×35 IMPLANT
SUT VIC AB 3-0 CT1 27 (SUTURE) ×4
SUT VIC AB 3-0 CT1 36 (SUTURE) ×7 IMPLANT
SUT VIC AB 3-0 CT1 TAPERPNT 27 (SUTURE) ×10 IMPLANT
SUT VIC AB 3-0 SH 27 (SUTURE) ×4
SUT VIC AB 3-0 SH 27X BRD (SUTURE) ×10 IMPLANT
SUT VICRYL 0 TIES 12 18 (SUTURE) ×7 IMPLANT
SYR TB 1ML 25GX5/8 (SYRINGE) ×7 IMPLANT
TOWEL OR 17X24 6PK STRL BLUE (TOWEL DISPOSABLE) ×14 IMPLANT
TRAY FOLEY CATH SILVER 14FR (SET/KITS/TRAYS/PACK) ×7 IMPLANT
WATER STERILE IRR 1000ML POUR (IV SOLUTION) IMPLANT

## 2015-09-21 NOTE — Anesthesia Preprocedure Evaluation (Signed)
Anesthesia Evaluation  Patient identified by MRN, date of birth, ID band  Reviewed: Allergy & Precautions, H&P , NPO status , Patient's Chart, lab work & pertinent test results  Airway Mallampati: I  TM Distance: >3 FB Neck ROM: full    Dental no notable dental hx. (+) Teeth Intact   Pulmonary    Pulmonary exam normal        Cardiovascular negative cardio ROS Normal cardiovascular exam     Neuro/Psych negative neurological ROS  negative psych ROS   GI/Hepatic negative GI ROS, Neg liver ROS,   Endo/Other  negative endocrine ROS  Renal/GU negative Renal ROS     Musculoskeletal   Abdominal Normal abdominal exam  (+)   Peds  Hematology   Anesthesia Other Findings   Reproductive/Obstetrics negative OB ROS                             Anesthesia Physical Anesthesia Plan  ASA: II  Anesthesia Plan: General   Post-op Pain Management:    Induction: Intravenous  Airway Management Planned: LMA  Additional Equipment:   Intra-op Plan:   Post-operative Plan:   Informed Consent: I have reviewed the patients History and Physical, chart, labs and discussed the procedure including the risks, benefits and alternatives for the proposed anesthesia with the patient or authorized representative who has indicated his/her understanding and acceptance.     Plan Discussed with: CRNA and Surgeon  Anesthesia Plan Comments:         Anesthesia Quick Evaluation

## 2015-09-21 NOTE — Transfer of Care (Signed)
Immediate Anesthesia Transfer of Care Note  Patient: Brittany Valenzuela  Procedure(s) Performed: Procedure(s): HYSTERECTOMY VAGINAL WITH BILATERAL SALPINGECTOMY  (Bilateral) TRANSVAGINAL TAPE (TVT) PROCEDURE (N/A) ANTERIOR (CYSTOCELE) AND POSTERIOR REPAIR (RECTOCELE) CYSTOSCOPY  Patient Location: PACU  Anesthesia Type:General  Level of Consciousness: sedated  Airway & Oxygen Therapy: Patient Spontanous Breathing and Patient connected to nasal cannula oxygen  Post-op Assessment: Report given to RN and Post -op Vital signs reviewed and stable  Post vital signs: Reviewed and stable  Last Vitals: There were no vitals filed for this visit.  Last Pain: There were no vitals filed for this visit.       Complications: No apparent anesthesia complications

## 2015-09-21 NOTE — Op Note (Signed)
Preop Diagnosis: SUI  Postop Diagnosis: SUI  Procedure:1.TVT 2. Cystoscopy  Complications:none  Procedure:  The patient was already under general anesthesia and prepped and draped in the normal sterile fashion in the dorsal lithotomy position. Dr. Cletis Media completed her portion of the procedure and a time out was performed at the start of the TVT.  Two 5 mm incisions were made 2 fingerbreadths from the midline.  Attention was turned to the anterior vaginal wall and it was injected with dilute pitressin at a concentration of 20 units of pitressin in a total of 100cc of normal saline.  An incision was made in the anterior wall of the vagina for approximately 1cm beneath the midurethra and the underlying tissue was dissected away from the anterior vaginal wall down to the level of the lower symphysis pubis bilaterally.   The transabdominal guide was then passed through the mons pubis incision on the patient's right down through the space of Retzius and out through the anterior vaginal wall after deflecting the rigid urethral catheter guide to the ipsilateral side. The same was done on the contralateral side. Cystoscopy was performed and no invadvertant bladder injury was noted. The bladder was drained with a Foley while deflecting the rigid urethral catheter guide to the patient's right and the mesh was attached to the transabdominal guide and elevated up through the space of Retzius and out through the incision on the mons pubis on the ipsilateral side. The same was done on the contralateral side. Cystoscopy was performed again and no inadvertant bladder injury was noted. The 33 French Foley was left in the urethra and a large Claiborne Billings was placed between the urethra and the mesh in order to leave the mesh slack beneath the midurethra. The mesh was then cut flush with the skin at the mons pubis incisions bilaterally.  Cystoscopy was performed again and bilateral ureters were noted to efflux without difficulty. The  bilateral incisions on the mons pubis were then cleaned and Liquaband applied. The anterior vaginal wall incision was repaired with 2-0 vicryl with interrupted stitches.  Vagina was packed with estrogen soaked vaginal packing.  Sponge, lap and needle count was correct.  The patient tolerated the procedure well and was returned to the recovery room in good condition.

## 2015-09-21 NOTE — Anesthesia Procedure Notes (Signed)
Procedure Name: LMA Insertion Date/Time: 09/21/2015 8:46 AM Performed by: Riki Sheer Pre-anesthesia Checklist: Patient identified, Emergency Drugs available, Suction available, Patient being monitored and Timeout performed Patient Re-evaluated:Patient Re-evaluated prior to inductionOxygen Delivery Method: Circle system utilized Preoxygenation: Pre-oxygenation with 100% oxygen Intubation Type: IV induction Ventilation: Mask ventilation without difficulty LMA: LMA inserted LMA Size: 4.0 Number of attempts: 1 Placement Confirmation: positive ETCO2,  CO2 detector and breath sounds checked- equal and bilateral Tube secured with: Tape Dental Injury: Teeth and Oropharynx as per pre-operative assessment

## 2015-09-21 NOTE — Op Note (Signed)
Preoperative diagnosis:pelvic prolapse with urinary stress incontinence  Postoperative diagnosis: Same   Anesthesia: General  Anesthesiologist: Dr Jillyn Hidden  Procedure: Total vaginal hysterectomy with anterior and posterior repair,perineoplasty and bilateral salpyngectomy  Surgeon: Dr. Katharine Look Darrion Macaulay   Assistant: Earnstine Regal P.A.-C.   Estimated blood loss: 150 cc   Procedure:   After being informed of the planned procedure with possible complications including but not limited to infection, bleeding, injury to other organs, need for laparotomy, expected hospitals they and recovery time, informed consent was obtained and patient was taken to or #7.   She was given general anesthesia with endotracheal intubation without any complication. She was placed in the lithotomy position prepped and draped in a sterile fashion and a Foley catheter was inserted in her bladder.   A weighted speculum is inserted in the vagina and the uterus was grasped with 1 Ardis Hughs forcep. We infiltrate the mucosa around the cervix using lidocaine 1% with epinephrine 1 in 200,000. We perform a circular incision around the cervix and then proceed with blunt and sharp dissection of the anterior and posterior vaginal mucosa. This identifies the posterior cul-de-sac which was sharply opened. The bladder was retracted upward . Both uterosacral ligaments are isolated on a Rogers forcep, sectioned, sutured with a transfixed suture of 0 Vicryl maintained for future suspension. The anterior cul-de-sac is then dissected up and the peritoneum is opened sharply. This allows for easy isolation of the vascular pedicle on each side using a Rogers forcep. The vascular pedicle is then sectioned and sutured with a transfixed suture of 0 Vicryl. We can then isolate part of the broad ligament on each side using a Rogers forceps. These pedicles are sectioned and sutured with a transfixed suture of 0 Vicryl. We now have easy access to the final  pedicle incorporating the round ligament, the utero-ovarian ligament and tube. This last pedicle is then clamped with Rogers forcep, sectioned and doubly sutured with a transfixed suture of 0 Vicryl and a free tie of 0 Vicryl. The uterus is removed.   Both ovaries are visualized and normal. We can easily access to both tubes so we proceed with bilateral salpingectomy by isolating the mesosalpinx on a Rogers forceps bilaterally. Each tube is then sharply removed and the pedicle is sutured with a transfix suture of 0 Vicryl and secured with a free tie of 0 Vicryl.   We then systematically verified hemostasis on all pedicles. Hemostasis is adequate. We proceed with closure of the posterior cul-de-sac by placing a suture reuniting the 2 uterosacral ligaments as well as the posterior cul-de-sac with 0 Vicryl. We then placed 2 vaginal suspension sutures using 0 Vicryl which will you reunite the posterior vaginal mucosa, the posterior peritoneum, the corresponding uterosacral ligaments and the anterior peritoneum. The sutures are then tied which closes the peritoneum at the same time.   Proceeding with anterior repair: We isolate the anterior vaginal mucosa using Allis forceps and infiltrated using Vasopressine 20 units in 100 CC of saline. The vaginal mucosa is then undermined medially and sharply dissected all the way to 1 cm below the urethrovesical junction. We proceed with sharp and blunt dissection of the prevesical fascia until the cystocele was completely freed. We then correct the cystocele with multilayered U- stitches of 2-0 Vicryl until complete resolution. Excess of vaginal mucosa is excised. The anterior vaginal mucosa is then closed using figure-of-eight stitches of 3-0 Vicryl.   The vaginal cuff is then closed transversely using figure-of-eight sutures of 3-0 Vicryl.  Proceeding with posterior repair: We grasped the posterior forceps on a distance of 2.5 cm infiltrate the posterior vaginal mucosa  using Vasopressine 20 units in 100 CC of saline. We sharply dissect the posterior vaginal mucosa midline until 2 cm from the vaginal cuff. We then sharply and bluntly dissect this mucosa away from the perirectal fascia until the rectocele was completely mobilized. We then correct the rectocele will multilayered U-stitches of 2-0 Vicryl until complete resolution. The excess vaginal mucosa is excised and the posterior vaginal mucosa is closed with a running lock suture of 3-0 Vicryl. The perineal muscles are then reapproximated with simple sutures of 3-0 Vicryl. The perineal skin is then closed with subcuticular suture of 3-0 Vicryl.   Instruments and sponge count is complete x2.   Estimated blood loss is 150 cc   Specimen: Uterus with 2 tubes sent to pathology  The patient's care is then turned to Dr Mancel Bale who will be placing a TVT ( separate dictation).

## 2015-09-21 NOTE — Interval H&P Note (Signed)
History and Physical Interval Note:  09/21/2015 8:17 AM  Brittany Valenzuela  has presented today for surgery, with the diagnosis of Pevlic Prolapse  The various methods of treatment have been discussed with the patient and family. After consideration of risks, benefits and other options for treatment, the patient has consented to  Procedure(s): HYSTERECTOMY VAGINAL WITH BILATERAL SALPINGECTOMY  (Bilateral) ANTERIOR (CYSTOCELE) AND POSTERIOR REPAIR (RECTOCELE) (N/A) TRANSVAGINAL TAPE (TVT) PROCEDURE (N/A) as a surgical intervention .  The patient's history has been reviewed, patient examined, no change in status, stable for surgery.  I have reviewed the patient's chart and labs.  Questions were answered to the patient's satisfaction.     Maddalynn Barnard A

## 2015-09-21 NOTE — Anesthesia Postprocedure Evaluation (Signed)
Anesthesia Post Note  Patient: Brittany Valenzuela  Procedure(s) Performed: Procedure(s) (LRB): HYSTERECTOMY VAGINAL WITH BILATERAL SALPINGECTOMY  (Bilateral) TRANSVAGINAL TAPE (TVT) PROCEDURE (N/A) ANTERIOR (CYSTOCELE) AND POSTERIOR REPAIR (RECTOCELE) CYSTOSCOPY  Patient location during evaluation: PACU Anesthesia Type: General Level of consciousness: awake Pain management: pain level controlled Respiratory status: spontaneous breathing Cardiovascular status: stable Postop Assessment: no signs of nausea or vomiting Anesthetic complications: no     Last Vitals:  Filed Vitals:   09/21/15 1415 09/21/15 1443  BP: 112/57 110/51  Pulse: 80 80  Temp: 36.4 C 36.6 C  Resp: 16 16    Last Pain:  Filed Vitals:   09/21/15 1444  PainSc: Asleep   Pain Goal:                 [name],[name]

## 2015-09-22 DIAGNOSIS — Z87410 Personal history of cervical dysplasia: Secondary | ICD-10-CM | POA: Diagnosis not present

## 2015-09-22 DIAGNOSIS — N393 Stress incontinence (female) (male): Secondary | ICD-10-CM | POA: Diagnosis not present

## 2015-09-22 DIAGNOSIS — Z8249 Family history of ischemic heart disease and other diseases of the circulatory system: Secondary | ICD-10-CM | POA: Diagnosis not present

## 2015-09-22 DIAGNOSIS — N813 Complete uterovaginal prolapse: Secondary | ICD-10-CM | POA: Diagnosis not present

## 2015-09-22 DIAGNOSIS — Z885 Allergy status to narcotic agent status: Secondary | ICD-10-CM | POA: Diagnosis not present

## 2015-09-22 DIAGNOSIS — Z833 Family history of diabetes mellitus: Secondary | ICD-10-CM | POA: Diagnosis not present

## 2015-09-22 LAB — CBC
HCT: 26 % — ABNORMAL LOW (ref 36.0–46.0)
Hemoglobin: 9.4 g/dL — ABNORMAL LOW (ref 12.0–15.0)
MCH: 39.7 pg — ABNORMAL HIGH (ref 26.0–34.0)
MCHC: 36.2 g/dL — AB (ref 30.0–36.0)
MCV: 109.7 fL — ABNORMAL HIGH (ref 78.0–100.0)
Platelets: 165 10*3/uL (ref 150–400)
RBC: 2.37 MIL/uL — ABNORMAL LOW (ref 3.87–5.11)
RDW: 15 % (ref 11.5–15.5)
WBC: 8.1 10*3/uL (ref 4.0–10.5)

## 2015-09-22 MED ORDER — HYDROCODONE-ACETAMINOPHEN 5-325 MG PO TABS
1.0000 | ORAL_TABLET | ORAL | Status: DC | PRN
  Administered 2015-09-22 – 2015-09-23 (×6): 2 via ORAL
  Filled 2015-09-22 (×6): qty 2

## 2015-09-22 MED ORDER — KETOROLAC TROMETHAMINE 30 MG/ML IJ SOLN
15.0000 mg | Freq: Four times a day (QID) | INTRAMUSCULAR | Status: DC
Start: 2015-09-22 — End: 2015-09-24
  Administered 2015-09-24: 15 mg via INTRAVENOUS
  Filled 2015-09-22 (×2): qty 1

## 2015-09-22 MED ORDER — DOCUSATE SODIUM 100 MG PO CAPS
100.0000 mg | ORAL_CAPSULE | Freq: Two times a day (BID) | ORAL | Status: DC
  Administered 2015-09-22 – 2015-09-24 (×5): 100 mg via ORAL
  Filled 2015-09-22 (×5): qty 1

## 2015-09-22 NOTE — Progress Notes (Signed)
Pt packing  Was taken out  This morning . Pt tolerated well. Packing had moderate saturation of dark red blood. Will continue to monitor pt closely

## 2015-09-22 NOTE — Progress Notes (Addendum)
Brittany Valenzuela is a67 y.o.  [id number]  Post Op Date # 1: TVH/BS/A-P Colporrhaphy/Placement of TVT  Subjective: Patient is Doing well postoperatively. Patient has Pain is controlled with current analgesics. Medications being used: prescription NSAID's including Ketorolac 30 mg IV and narcotic analgesics including Vicodin 5/325.Marland Kitchen Ambulated in the room with transient lightheadedness,  no longer nauseous and tolerating liquids with Toast.  Has voided since Foley was removed (approximately 6:30 a.m. and hasn't passed flatus.    Objective: Vital signs in last 24 hours: Temp:  [97.5 F (36.4 C)-99 F (37.2 C)] 98.4 F (36.9 C) (07/20 0522) Pulse Rate:  [64-89] 68 (07/20 0522) Resp:  [11-18] 14 (07/20 0522) BP: (82-127)/(35-79) 89/41 mmHg (07/20 0522) SpO2:  [94 %-100 %] 97 % (07/20 0522) Weight:  [163 lb (73.936 kg)] 163 lb (73.936 kg) (07/19 1500)  Intake/Output from previous day: 07/19 0701 - 07/20 0700 In: 5192.9 [P.O.:920; I.V.:4272.9] Out: 2500 [Urine:2350] Intake/Output this shift:    Recent Labs Lab 09/15/15 1135 09/22/15 0524  WBC 4.7 8.1  HGB 12.2 9.4*  HCT 34.5* 26.0*  PLT 210 165     Recent Labs Lab 09/15/15 1135  NA 137  K 4.5  CL 103  CO2 29  BUN 25*  CREATININE 0.57  CALCIUM 9.1  GLUCOSE 89    EXAM: General: alert, appears stated age and no distress Resp: clear to auscultation bilaterally Cardio: regular rate and rhythm, S1, S2 normal, no murmur, click, rub or gallop GI: Bowel sunds present and soft Extremities: extremities normal, atraumatic, no cyanosis or edema and no calf tenderness Vaginal Bleeding: faint dry pink stain throughout the perineal pad   Assessment: s/p Procedure(s): HYSTERECTOMY VAGINAL WITH BILATERAL SALPINGECTOMY  TRANSVAGINAL TAPE (TVT) PROCEDURE ANTERIOR (CYSTOCELE) AND POSTERIOR REPAIR (RECTOCELE) CYSTOSCOPY: stable, tolerating diet and anemia  Plan: Advance diet Encourage ambulation Awating voiding trial  Routine  care  LOS: 1 day    [name],[name], PA-C 09/22/2015 7:54 AM  Pt voided 30cc and the 75cc.  She feels like she did not empty.  Catheterization ordered and 400cc obtained at 12:30p.  I gave pt instructions and will do another TOV.  Seen and agreed Reviewed pain management Awaiting 2nd trial of voiding

## 2015-09-22 NOTE — Addendum Note (Signed)
Addendum  created 09/22/15 0758 by Riki Sheer, CRNA   Modules edited: Clinical Notes   Clinical Notes:  File: [id number]

## 2015-09-22 NOTE — Anesthesia Postprocedure Evaluation (Signed)
Anesthesia Post Note  Patient: Brittany Valenzuela  Procedure(s) Performed: Procedure(s) (LRB): HYSTERECTOMY VAGINAL WITH BILATERAL SALPINGECTOMY  (Bilateral) TRANSVAGINAL TAPE (TVT) PROCEDURE (N/A) ANTERIOR (CYSTOCELE) AND POSTERIOR REPAIR (RECTOCELE) CYSTOSCOPY  Patient location during evaluation: [facility] Anesthesia Type: General Level of consciousness: awake and alert Pain management: pain level controlled Vital Signs Assessment: post-procedure vital signs reviewed and stable Respiratory status: spontaneous breathing, nonlabored ventilation and respiratory function stable Cardiovascular status: blood pressure returned to baseline and stable Postop Assessment: no signs of nausea or vomiting Anesthetic complications: no     Last Vitals:  Filed Vitals:   09/22/15 0153 09/22/15 0522  BP: 106/79 89/41  Pulse: 73 68  Temp: 36.7 C 36.9 C  Resp: 14 14    Last Pain:  Filed Vitals:   09/22/15 0540  PainSc: 8    Pain Goal: Patients Stated Pain Goal: 3 (09/22/15 0539)               Riki Sheer

## 2015-09-23 DIAGNOSIS — Z8249 Family history of ischemic heart disease and other diseases of the circulatory system: Secondary | ICD-10-CM | POA: Diagnosis not present

## 2015-09-23 DIAGNOSIS — Z87410 Personal history of cervical dysplasia: Secondary | ICD-10-CM | POA: Diagnosis not present

## 2015-09-23 DIAGNOSIS — Z833 Family history of diabetes mellitus: Secondary | ICD-10-CM | POA: Diagnosis not present

## 2015-09-23 DIAGNOSIS — Z885 Allergy status to narcotic agent status: Secondary | ICD-10-CM | POA: Diagnosis not present

## 2015-09-23 DIAGNOSIS — N813 Complete uterovaginal prolapse: Secondary | ICD-10-CM | POA: Diagnosis not present

## 2015-09-23 DIAGNOSIS — N393 Stress incontinence (female) (male): Secondary | ICD-10-CM | POA: Diagnosis not present

## 2015-09-23 MED ORDER — OXYCODONE HCL 5 MG PO TABS: 5.0000 mg | ORAL_TABLET | Freq: Four times a day (QID) | ORAL | Status: DC | PRN

## 2015-09-23 MED ORDER — IBUPROFEN 600 MG PO TABS
600.0000 mg | ORAL_TABLET | Freq: Three times a day (TID) | ORAL | Status: DC
  Administered 2015-09-23 – 2015-09-24 (×4): 600 mg via ORAL
  Filled 2015-09-23 (×4): qty 1

## 2015-09-23 MED ORDER — ACETAMINOPHEN 325 MG PO TABS
650.0000 mg | ORAL_TABLET | Freq: Four times a day (QID) | ORAL | Status: DC | PRN
Start: 2015-09-23 — End: 2015-09-24
  Administered 2015-09-23: 650 mg via ORAL
  Filled 2015-09-23: qty 2

## 2015-09-23 MED ORDER — ONDANSETRON HCL 4 MG PO TABS: 4.0000 mg | ORAL_TABLET | Freq: Four times a day (QID) | ORAL | Status: DC | PRN

## 2015-09-23 MED ORDER — CIPROFLOXACIN HCL 500 MG PO TABS: 500.0000 mg | ORAL_TABLET | Freq: Two times a day (BID) | ORAL | Status: DC

## 2015-09-23 MED ORDER — IBUPROFEN 600 MG PO TABS: ORAL_TABLET | ORAL | Status: DC

## 2015-09-23 MED ORDER — HYDROCODONE-ACETAMINOPHEN 5-325 MG PO TABS: 1.0000 | ORAL_TABLET | Freq: Four times a day (QID) | ORAL | Status: DC | PRN

## 2015-09-23 NOTE — Progress Notes (Signed)
Verified with PA that pt is to have norco and dilaudid available for pain as needed, she confirmed. Marry Guan

## 2015-09-23 NOTE — Progress Notes (Signed)
Brittany Valenzuela is a18 y.o.  [id number]  Post Op Date # 2:  TVH/BS/A-P Colporrhaphy/Placement of TVT/Cystoscopy  Subjective: Patient is Doing well postoperatively. Reports intermittent sharp and "jamming" pain in vaginal area but mostly at night. Patient has transient lightheadedness with ambulation and complains of pelvic discomfort with urination as well as discomfort with acutal urination.  Is tolerating a regular diet, feels more and more as if her bladder is emptying completely (though she says that it is not 100 % yet).   Objective: Vital signs in last 24 hours: Temp:  [97.5 F (36.4 C)-98.6 F (37 C)] 98.3 F (36.8 C) (07/21 0527) Pulse Rate:  [64-67] 66 (07/21 0527) Resp:  [16-18] 18 (07/21 0527) BP: (83-120)/(45-81) 120/54 mmHg (07/21 0527) SpO2:  [91 %-97 %] 97 % (07/21 0527)  Intake/Output from previous day: 07/20 0701 - 07/21 0700 In: 800 [P.O.:800] Out: 2055 [Urine:2055] Intake/Output this shift:    Recent Labs Lab 09/22/15 0524  WBC 8.1  HGB 9.4*  HCT 26.0*  PLT 165    No results for input(s): NA, K, CL, CO2, BUN, CREATININE, CALCIUM, PROT, BILITOT, ALKPHOS, ALT, AST, GLUCOSE in the last 168 hours.  Invalid input(s): LABALBU  EXAM: General: alert, cooperative, no distress and eating breakfast. Resp: clear to auscultation bilaterally Cardio: regular rate and rhythm, S1, S2 normal, no murmur, click, rub or gallop GI: Bowel sounds present and soft. Extremities: no calf tenderness  Vaginal Bleeding: scant dry pink stain.   Assessment: s/p Procedure(s): HYSTERECTOMY VAGINAL WITH BILATERAL SALPINGECTOMY  TRANSVAGINAL TAPE (TVT) PROCEDURE ANTERIOR (CYSTOCELE) AND POSTERIOR REPAIR (RECTOCELE) CYSTOSCOPY: stable, progressing well, tolerating diet, anemia and improved bladder emptying.  Plan: Routine care per Drs.  Rivard and Mancel Bale  Per Dr. Mancel Bale,  patient should be catheterized,  if she doesn't feel as if her bladder is empty and urine sent for  culture.  May have Uribel for comfort. Consider discharge home  LOS: 2 days    [name],[name], PA-C 09/23/2015 8:01 AM  Pt had 800cc at time of catheterization.  TOV will check on pt's voiding by 4pm.

## 2015-09-23 NOTE — Discharge Summary (Signed)
Physician Discharge Summary  Patient ID: Brittany Valenzuela MRN: [id number] DOB/AGE: [id number] 69 y.o.  Admit date: 09/21/2015 Discharge date: 09/23/2015   Discharge Diagnoses: Symptomatic Pelvic Prolapse Active Problems:   Pelvic prolapse   Operation: Total Vaginal Hysterectomy, Bilateral Salpingectomy, Anterior-Posterior Colporrhaphy, Placement of Tension Free Vaginal Tape and Cystoscopy   Discharged Condition: Good  Hospital Course: On the date of admission the patient underwent the aforementioned procedures,  tolerating them well.  Post operative course was marked by bladder dysfunction. Patient left with Foley catheter and a follow-up appointment in the office on 09/28/15 to remove it for a voiding trial.  Disposition: 01-Home or Self Care  Discharge Medications:    Medication List    STOP taking these medications        traMADol 50 MG tablet  Commonly known as:  ULTRAM      TAKE these medications        ciprofloxacin 500 MG tablet  Commonly known as:  CIPRO  Take 1 tablet (500 mg total) by mouth 2 (two) times daily.     Dilaudid 2 mg  Commonly known as:  NORCO/VICODIN  Take 1 tablet by mouth every 6 (six) hours as needed for severe pain.     ibuprofen 600 MG tablet  Commonly known as:  ADVIL,MOTRIN  1 po  pc every 6 hours for 5 days then as needed for pain     multivitamin with minerals Tabs tablet  Take 1 tablet by mouth daily.     ondansetron 4 MG tablet  Commonly known as:  ZOFRAN  Take 1 tablet (4 mg total) by mouth every 6 (six) hours as needed for nausea.     raloxifene 60 MG tablet  Commonly known as:  EVISTA  Take 60 mg by mouth daily.     sodium chloride 0.65 % Soln nasal spray  Commonly known as:  OCEAN  Place 1 spray into both nostrils as needed for congestion.     Vitamin D (Ergocalciferol) 50000 units Caps capsule  Commonly known as:  DRISDOL  Take 50,000 Units by mouth 2 (two) times a week. Takes on Tuesday and Friday.            Follow-up: Dr. Everett Graff 09/28/15 at 2:45 pm         Dr Katharine Look Cerys Winget in 6 weeks as scheduled   Signed: [name],[name], PA-C 09/23/2015, 8:16 AM

## 2015-09-23 NOTE — Progress Notes (Signed)
Notified Dr. Mancel Bale that pt had urine output of 175cc. We scanned her bladder for over 600cc. Order received to I/O cath and monitor until 2200 for at least 300 cc, if not, replace foley. Nursing will monitor Marry Guan

## 2015-09-24 LAB — URINE CULTURE
Culture: <10000 — AB
Special Requests: NORMAL

## 2015-09-24 MED ORDER — HYDROMORPHONE HCL 2 MG PO TABS: 1.0000 mg | ORAL_TABLET | Freq: Four times a day (QID) | ORAL | Status: DC | PRN

## 2015-09-24 MED ORDER — IBUPROFEN 600 MG PO TABS: 600.0000 mg | ORAL_TABLET | Freq: Three times a day (TID) | ORAL | Status: AC

## 2015-09-24 NOTE — Discharge Instructions (Signed)
Call [facility] @ [phone] if:  You have a temperature greater than or equal to 100.4 degrees Farenheit orally You have pain that is not made better by the pain medication given and taken as directed You have excessive bleeding or problems urinating  Take Colace (Docusate Sodium/Stool Softener) 100 mg 2-3 times daily while taking narcotic pain medicine to avoid constipation or until bowel movements are regular. Take Ibuprofen, with food, every 6 hours for 5 days then as needed for pain Uribel has been prescribed for bladder comfort when you urinate.  The prescription has been sent to your pharmacy.  It will turn your urine blue  You may drive after 2 weeks You may walk up steps  You may shower  You may resume a regular diet  Keep incisions clean and dry Do not lift over 15 pounds for 6 weeks Avoid anything in vagina for 6 weeks (or until after your post-operative visit)   You have an appointment with Dr Everett Graff on Wednesday 09/27/25 at 2:45 pm. Please clamp Foley 3 hours before your appointment. They will remove your catheter and then measure how much you can void on your own.

## 2015-09-24 NOTE — Progress Notes (Signed)
Discharge instructions reviewed, all questions answered, IV removed prior to discharge.  Pt sent home with foley in place.  Educated on foley care and maintenance.  In stable condition upon discharge.  Escorted to main entrance by RN with family.

## 2015-09-24 NOTE — Progress Notes (Signed)
3 Days Post-Op Procedure(s) (LRB): HYSTERECTOMY VAGINAL WITH BILATERAL SALPINGECTOMY  (Bilateral) TRANSVAGINAL TAPE (TVT) PROCEDURE (N/A) ANTERIOR (CYSTOCELE) AND POSTERIOR REPAIR (RECTOCELE) CYSTOSCOPY  Subjective: Patient reports that pain is well managed.  Tolerating normal diet  diet without difficulty. No nausea / vomiting.  Ambulating and voiding.  Objective: BP 105/55 mmHg  Pulse 65  Temp(Src) 98.6 F (37 C) (Oral)  Resp 16  Ht 5' 7.5" (1.715 m)  Wt 163 lb (73.936 kg)  BMI 25.14 kg/m2  SpO2 95% Lungs: clear Heart: normal rate and rhythm Abdomen:soft and appropriately tender Extremities: Homans sign is negative, no sign of DVT Incision: healing well  Assessment: s/p Procedure(s): HYSTERECTOMY VAGINAL WITH BILATERAL SALPINGECTOMY  TRANSVAGINAL TAPE (TVT) PROCEDURE ANTERIOR (CYSTOCELE) AND POSTERIOR REPAIR (RECTOCELE) CYSTOSCOPY: stable  Plan: Discharge home with Foley catheter to be removed in office 09/28/15  LOS: 3 days    Kinzly Pierrelouis A 09/24/2015, 1:01 PM

## 2015-09-28 DIAGNOSIS — R339 Retention of urine, unspecified: Secondary | ICD-10-CM | POA: Diagnosis not present

## 2015-09-28 DIAGNOSIS — Z01419 Encounter for gynecological examination (general) (routine) without abnormal findings: Secondary | ICD-10-CM | POA: Diagnosis not present

## 2015-09-28 DIAGNOSIS — Z9189 Other specified personal risk factors, not elsewhere classified: Secondary | ICD-10-CM | POA: Diagnosis not present

## 2015-09-28 DIAGNOSIS — B379 Candidiasis, unspecified: Secondary | ICD-10-CM | POA: Diagnosis not present

## 2015-09-28 DIAGNOSIS — Z09 Encounter for follow-up examination after completed treatment for conditions other than malignant neoplasm: Secondary | ICD-10-CM | POA: Diagnosis not present

## 2015-09-28 DIAGNOSIS — N390 Urinary tract infection, site not specified: Secondary | ICD-10-CM | POA: Diagnosis not present

## 2015-10-05 ENCOUNTER — Encounter (HOSPITAL_COMMUNITY): Payer: Self-pay | Admitting: Obstetrics and Gynecology

## 2015-11-11 DIAGNOSIS — Z0000 Encounter for general adult medical examination without abnormal findings: Secondary | ICD-10-CM | POA: Diagnosis not present

## 2015-11-11 DIAGNOSIS — M810 Age-related osteoporosis without current pathological fracture: Secondary | ICD-10-CM | POA: Diagnosis not present

## 2015-11-11 DIAGNOSIS — Z1389 Encounter for screening for other disorder: Secondary | ICD-10-CM | POA: Diagnosis not present

## 2015-11-11 DIAGNOSIS — D509 Iron deficiency anemia, unspecified: Secondary | ICD-10-CM | POA: Diagnosis not present

## 2015-12-01 DIAGNOSIS — S139XXA Sprain of joints and ligaments of unspecified parts of neck, initial encounter: Secondary | ICD-10-CM | POA: Diagnosis not present

## 2015-12-01 DIAGNOSIS — M545 Low back pain: Secondary | ICD-10-CM | POA: Diagnosis not present

## 2015-12-21 ENCOUNTER — Encounter: Payer: Self-pay | Admitting: *Deleted

## 2015-12-21 DIAGNOSIS — M542 Cervicalgia: Secondary | ICD-10-CM | POA: Diagnosis not present

## 2015-12-21 DIAGNOSIS — M545 Low back pain: Secondary | ICD-10-CM | POA: Diagnosis not present

## 2015-12-26 DIAGNOSIS — M545 Low back pain: Secondary | ICD-10-CM | POA: Diagnosis not present

## 2015-12-26 DIAGNOSIS — M542 Cervicalgia: Secondary | ICD-10-CM | POA: Diagnosis not present

## 2015-12-28 DIAGNOSIS — M545 Low back pain: Secondary | ICD-10-CM | POA: Diagnosis not present

## 2015-12-28 DIAGNOSIS — M542 Cervicalgia: Secondary | ICD-10-CM | POA: Diagnosis not present

## 2015-12-31 DIAGNOSIS — B349 Viral infection, unspecified: Secondary | ICD-10-CM | POA: Diagnosis not present

## 2016-01-04 DIAGNOSIS — J209 Acute bronchitis, unspecified: Secondary | ICD-10-CM | POA: Diagnosis not present

## 2016-01-10 DIAGNOSIS — M545 Low back pain: Secondary | ICD-10-CM | POA: Diagnosis not present

## 2016-01-10 DIAGNOSIS — M542 Cervicalgia: Secondary | ICD-10-CM | POA: Diagnosis not present

## 2016-01-12 DIAGNOSIS — R0789 Other chest pain: Secondary | ICD-10-CM | POA: Diagnosis not present

## 2016-01-12 DIAGNOSIS — J209 Acute bronchitis, unspecified: Secondary | ICD-10-CM | POA: Diagnosis not present

## 2016-01-12 DIAGNOSIS — R079 Chest pain, unspecified: Secondary | ICD-10-CM | POA: Diagnosis not present

## 2016-01-12 DIAGNOSIS — R05 Cough: Secondary | ICD-10-CM | POA: Diagnosis not present

## 2016-02-01 DIAGNOSIS — M545 Low back pain: Secondary | ICD-10-CM | POA: Diagnosis not present

## 2016-02-01 DIAGNOSIS — M542 Cervicalgia: Secondary | ICD-10-CM | POA: Diagnosis not present

## 2016-02-08 DIAGNOSIS — M545 Low back pain: Secondary | ICD-10-CM | POA: Diagnosis not present

## 2016-02-08 DIAGNOSIS — M542 Cervicalgia: Secondary | ICD-10-CM | POA: Diagnosis not present

## 2016-02-14 DIAGNOSIS — M542 Cervicalgia: Secondary | ICD-10-CM | POA: Diagnosis not present

## 2016-02-14 DIAGNOSIS — M545 Low back pain: Secondary | ICD-10-CM | POA: Diagnosis not present

## 2016-02-16 DIAGNOSIS — H2513 Age-related nuclear cataract, bilateral: Secondary | ICD-10-CM | POA: Diagnosis not present

## 2016-02-16 DIAGNOSIS — Z0100 Encounter for examination of eyes and vision without abnormal findings: Secondary | ICD-10-CM | POA: Diagnosis not present

## 2016-02-16 DIAGNOSIS — H25043 Posterior subcapsular polar age-related cataract, bilateral: Secondary | ICD-10-CM | POA: Diagnosis not present

## 2016-02-21 DIAGNOSIS — M545 Low back pain: Secondary | ICD-10-CM | POA: Diagnosis not present

## 2016-02-21 DIAGNOSIS — M542 Cervicalgia: Secondary | ICD-10-CM | POA: Diagnosis not present

## 2016-03-23 DIAGNOSIS — Z23 Encounter for immunization: Secondary | ICD-10-CM | POA: Diagnosis not present

## 2016-05-18 DIAGNOSIS — S335XXA Sprain of ligaments of lumbar spine, initial encounter: Secondary | ICD-10-CM | POA: Diagnosis not present

## 2016-05-18 DIAGNOSIS — S139XXA Sprain of joints and ligaments of unspecified parts of neck, initial encounter: Secondary | ICD-10-CM | POA: Diagnosis not present

## 2016-06-08 DIAGNOSIS — M545 Low back pain: Secondary | ICD-10-CM | POA: Diagnosis not present

## 2016-06-08 DIAGNOSIS — M542 Cervicalgia: Secondary | ICD-10-CM | POA: Diagnosis not present

## 2016-06-12 DIAGNOSIS — Z01419 Encounter for gynecological examination (general) (routine) without abnormal findings: Secondary | ICD-10-CM | POA: Diagnosis not present

## 2016-06-12 DIAGNOSIS — M810 Age-related osteoporosis without current pathological fracture: Secondary | ICD-10-CM | POA: Diagnosis not present

## 2016-06-12 DIAGNOSIS — Z6826 Body mass index (BMI) 26.0-26.9, adult: Secondary | ICD-10-CM | POA: Diagnosis not present

## 2016-06-12 DIAGNOSIS — R3915 Urgency of urination: Secondary | ICD-10-CM | POA: Diagnosis not present

## 2016-06-12 DIAGNOSIS — Z1231 Encounter for screening mammogram for malignant neoplasm of breast: Secondary | ICD-10-CM | POA: Diagnosis not present

## 2016-06-12 DIAGNOSIS — Z1211 Encounter for screening for malignant neoplasm of colon: Secondary | ICD-10-CM | POA: Diagnosis not present

## 2016-06-20 DIAGNOSIS — M545 Low back pain: Secondary | ICD-10-CM | POA: Diagnosis not present

## 2016-06-20 DIAGNOSIS — M542 Cervicalgia: Secondary | ICD-10-CM | POA: Diagnosis not present

## 2016-06-29 DIAGNOSIS — M542 Cervicalgia: Secondary | ICD-10-CM | POA: Diagnosis not present

## 2016-06-29 DIAGNOSIS — M545 Low back pain: Secondary | ICD-10-CM | POA: Diagnosis not present

## 2016-07-03 DIAGNOSIS — M545 Low back pain: Secondary | ICD-10-CM | POA: Diagnosis not present

## 2016-07-03 DIAGNOSIS — M542 Cervicalgia: Secondary | ICD-10-CM | POA: Diagnosis not present

## 2016-07-06 DIAGNOSIS — M542 Cervicalgia: Secondary | ICD-10-CM | POA: Diagnosis not present

## 2016-07-06 DIAGNOSIS — M545 Low back pain: Secondary | ICD-10-CM | POA: Diagnosis not present

## 2016-07-09 DIAGNOSIS — M542 Cervicalgia: Secondary | ICD-10-CM | POA: Diagnosis not present

## 2016-07-09 DIAGNOSIS — M545 Low back pain: Secondary | ICD-10-CM | POA: Diagnosis not present

## 2016-07-13 DIAGNOSIS — M545 Low back pain: Secondary | ICD-10-CM | POA: Diagnosis not present

## 2016-07-13 DIAGNOSIS — M542 Cervicalgia: Secondary | ICD-10-CM | POA: Diagnosis not present
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
## 2016-07-17 DIAGNOSIS — M542 Cervicalgia: Secondary | ICD-10-CM | POA: Diagnosis not present

## 2016-07-17 DIAGNOSIS — M545 Low back pain: Secondary | ICD-10-CM | POA: Diagnosis not present

## 2016-07-31 ENCOUNTER — Other Ambulatory Visit: Payer: Self-pay | Admitting: Sports Medicine

## 2016-07-31 DIAGNOSIS — M545 Low back pain: Principal | ICD-10-CM

## 2016-07-31 DIAGNOSIS — G8929 Other chronic pain: Secondary | ICD-10-CM

## 2016-08-01 ENCOUNTER — Ambulatory Visit
Admission: RE | Admit: 2016-08-01 | Discharge: 2016-08-01 | Disposition: A | Discharge status: Home / Self Care | Payer: Medicare Other | Source: Ambulatory Visit | Attending: Sports Medicine | Admitting: Sports Medicine

## 2016-08-01 DIAGNOSIS — G8929 Other chronic pain: Secondary | ICD-10-CM

## 2016-08-01 DIAGNOSIS — M545 Low back pain: Principal | ICD-10-CM

## 2016-08-01 MED ORDER — IOPAMIDOL (ISOVUE-M 200) INJECTION 41%
1.0000 mL | Freq: Once | INTRAMUSCULAR | Status: AC
  Administered 2016-08-01: 1 mL via EPIDURAL

## 2016-08-01 MED ORDER — METHYLPREDNISOLONE ACETATE 40 MG/ML INJ SUSP (RADIOLOG
120.0000 mg | Freq: Once | INTRAMUSCULAR | Status: AC
  Administered 2016-08-01: 120 mg via EPIDURAL

## 2016-08-01 NOTE — Discharge Instructions (Signed)

## 2016-09-20 DIAGNOSIS — H1131 Conjunctival hemorrhage, right eye: Secondary | ICD-10-CM | POA: Diagnosis not present

## 2016-09-25 ENCOUNTER — Other Ambulatory Visit: Payer: Self-pay | Admitting: Sports Medicine

## 2016-09-25 DIAGNOSIS — M545 Low back pain: Secondary | ICD-10-CM | POA: Diagnosis not present

## 2016-09-25 DIAGNOSIS — G8929 Other chronic pain: Secondary | ICD-10-CM

## 2016-10-03 ENCOUNTER — Ambulatory Visit
Admission: RE | Admit: 2016-10-03 | Discharge: 2016-10-03 | Disposition: A | Discharge status: Home / Self Care | Payer: Medicare Other | Source: Ambulatory Visit | Attending: Sports Medicine | Admitting: Sports Medicine

## 2016-10-03 DIAGNOSIS — M545 Low back pain: Secondary | ICD-10-CM | POA: Diagnosis not present

## 2016-10-03 DIAGNOSIS — G8929 Other chronic pain: Secondary | ICD-10-CM

## 2016-10-03 MED ORDER — METHYLPREDNISOLONE ACETATE 40 MG/ML INJ SUSP (RADIOLOG
120.0000 mg | Freq: Once | INTRAMUSCULAR | Status: AC
  Administered 2016-10-03: 120 mg via EPIDURAL

## 2016-10-03 MED ORDER — IOPAMIDOL (ISOVUE-M 200) INJECTION 41%
1.0000 mL | Freq: Once | INTRAMUSCULAR | Status: AC
  Administered 2016-10-03: 1 mL via EPIDURAL

## 2016-10-03 NOTE — Discharge Instructions (Signed)

## 2016-11-27 DIAGNOSIS — M545 Low back pain: Secondary | ICD-10-CM | POA: Diagnosis not present

## 2016-11-29 DIAGNOSIS — F322 Major depressive disorder, single episode, severe without psychotic features: Secondary | ICD-10-CM | POA: Diagnosis not present

## 2016-11-29 DIAGNOSIS — M810 Age-related osteoporosis without current pathological fracture: Secondary | ICD-10-CM | POA: Diagnosis not present

## 2016-11-29 DIAGNOSIS — Z0000 Encounter for general adult medical examination without abnormal findings: Secondary | ICD-10-CM | POA: Diagnosis not present

## 2016-11-29 DIAGNOSIS — Z1389 Encounter for screening for other disorder: Secondary | ICD-10-CM | POA: Diagnosis not present

## 2016-11-29 DIAGNOSIS — Z23 Encounter for immunization: Secondary | ICD-10-CM | POA: Diagnosis not present

## 2016-11-29 DIAGNOSIS — J302 Other seasonal allergic rhinitis: Secondary | ICD-10-CM | POA: Diagnosis not present

## 2016-11-29 DIAGNOSIS — D509 Iron deficiency anemia, unspecified: Secondary | ICD-10-CM | POA: Diagnosis not present

## 2016-11-29 DIAGNOSIS — Z5181 Encounter for therapeutic drug level monitoring: Secondary | ICD-10-CM | POA: Diagnosis not present

## 2016-12-04 DIAGNOSIS — M545 Low back pain: Secondary | ICD-10-CM | POA: Diagnosis not present

## 2016-12-11 DIAGNOSIS — R202 Paresthesia of skin: Secondary | ICD-10-CM | POA: Diagnosis not present

## 2016-12-11 DIAGNOSIS — R200 Anesthesia of skin: Secondary | ICD-10-CM | POA: Diagnosis not present

## 2016-12-21 DIAGNOSIS — D7589 Other specified diseases of blood and blood-forming organs: Secondary | ICD-10-CM | POA: Diagnosis not present

## 2016-12-21 DIAGNOSIS — R5383 Other fatigue: Secondary | ICD-10-CM | POA: Diagnosis not present

## 2016-12-25 DIAGNOSIS — G629 Polyneuropathy, unspecified: Secondary | ICD-10-CM | POA: Diagnosis not present

## 2016-12-27 DIAGNOSIS — E538 Deficiency of other specified B group vitamins: Secondary | ICD-10-CM | POA: Diagnosis not present

## 2017-01-02 DIAGNOSIS — E538 Deficiency of other specified B group vitamins: Secondary | ICD-10-CM | POA: Diagnosis not present

## 2017-01-08 DIAGNOSIS — E538 Deficiency of other specified B group vitamins: Secondary | ICD-10-CM | POA: Diagnosis not present

## 2017-01-11 ENCOUNTER — Encounter: Payer: Self-pay | Admitting: Neurology

## 2017-01-11 ENCOUNTER — Ambulatory Visit (INDEPENDENT_AMBULATORY_CARE_PROVIDER_SITE_OTHER): Payer: Medicare Other | Admitting: Neurology

## 2017-01-11 VITALS — BP 123/70 | HR 78 | Ht 68.0 in | Wt 155.5 lb

## 2017-01-11 DIAGNOSIS — R202 Paresthesia of skin: Secondary | ICD-10-CM | POA: Diagnosis not present
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
## 2017-01-11 NOTE — Patient Instructions (Signed)
   We will get MRI of the cervical spine. 

## 2017-01-14 ENCOUNTER — Telehealth: Payer: Self-pay | Admitting: *Deleted

## 2017-01-14 LAB — COPPER, SERUM: COPPER: 113 ug/dL (ref 72–166)

## 2017-01-14 LAB — B. BURGDORFI ANTIBODIES: Lyme IgG/IgM Ab: <0.91 {ISR} (ref 0.00–0.90)

## 2017-01-14 LAB — RPR: RPR: NONREACTIVE

## 2017-01-14 LAB — ANA W/REFLEX: ANA: NEGATIVE

## 2017-01-14 LAB — ANGIOTENSIN CONVERTING ENZYME: Angio Convert Enzyme: 45 U/L (ref 14–82)

## 2017-01-14 LAB — SEDIMENTATION RATE: SED RATE: 2 mm/h (ref 0–40)

## 2017-01-14 LAB — HIV ANTIBODY (ROUTINE TESTING W REFLEX): HIV Screen 4th Generation wRfx: NONREACTIVE

## 2017-01-14 NOTE — Telephone Encounter (Signed)
Called and spoke with pt about unremarkable labs per CW, MD note. She verbalized understanding. She asked if B12 level checked, advised that was not. She asked about copper level. Advised hers was 113 and reference range (72-166).

## 2017-01-14 NOTE — Telephone Encounter (Signed)
-----   Message from Kathrynn Ducking, MD sent at 01/14/2017  1:54 PM EST -----  The blood work results are unremarkable. Please call the patient.  ----- Message ----- From: Lavone Neri Lab Results In Sent: 01/12/2017   7:40 AM To: Kathrynn Ducking, MD

## 2017-01-15 DIAGNOSIS — M545 Low back pain: Secondary | ICD-10-CM | POA: Diagnosis not present

## 2017-01-16 DIAGNOSIS — E538 Deficiency of other specified B group vitamins: Secondary | ICD-10-CM | POA: Diagnosis not present

## 2017-01-22 ENCOUNTER — Other Ambulatory Visit: Payer: Self-pay | Admitting: Sports Medicine

## 2017-01-22 DIAGNOSIS — M545 Low back pain: Principal | ICD-10-CM

## 2017-01-22 DIAGNOSIS — G8929 Other chronic pain: Secondary | ICD-10-CM

## 2017-02-08 DIAGNOSIS — E538 Deficiency of other specified B group vitamins: Secondary | ICD-10-CM | POA: Diagnosis not present

## 2017-02-18 ENCOUNTER — Other Ambulatory Visit: Payer: Medicare Other

## 2017-02-18 DIAGNOSIS — D510 Vitamin B12 deficiency anemia due to intrinsic factor deficiency: Secondary | ICD-10-CM | POA: Diagnosis not present

## 2017-02-18 DIAGNOSIS — M25472 Effusion, left ankle: Secondary | ICD-10-CM | POA: Diagnosis not present

## 2017-02-18 DIAGNOSIS — R110 Nausea: Secondary | ICD-10-CM | POA: Diagnosis not present

## 2017-02-18 DIAGNOSIS — M25471 Effusion, right ankle: Secondary | ICD-10-CM | POA: Diagnosis not present

## 2017-03-04 ENCOUNTER — Ambulatory Visit
Admission: RE | Admit: 2017-03-04 | Discharge: 2017-03-04 | Disposition: A | Discharge status: Home / Self Care | Payer: Medicare Other | Source: Ambulatory Visit | Attending: Sports Medicine | Admitting: Sports Medicine

## 2017-03-04 DIAGNOSIS — M545 Low back pain: Secondary | ICD-10-CM | POA: Diagnosis not present

## 2017-03-04 DIAGNOSIS — G8929 Other chronic pain: Secondary | ICD-10-CM

## 2017-03-04 MED ORDER — IOPAMIDOL (ISOVUE-M 200) INJECTION 41%
1.0000 mL | Freq: Once | INTRAMUSCULAR | Status: AC
  Administered 2017-03-04: 1 mL via EPIDURAL

## 2017-03-04 MED ORDER — METHYLPREDNISOLONE ACETATE 40 MG/ML INJ SUSP (RADIOLOG
120.0000 mg | Freq: Once | INTRAMUSCULAR | Status: AC
  Administered 2017-03-04: 120 mg via EPIDURAL

## 2017-03-04 NOTE — Discharge Instructions (Signed)

## 2017-03-12 DIAGNOSIS — E538 Deficiency of other specified B group vitamins: Secondary | ICD-10-CM | POA: Diagnosis not present

## 2017-04-12 DIAGNOSIS — H9209 Otalgia, unspecified ear: Secondary | ICD-10-CM | POA: Diagnosis not present

## 2017-04-12 DIAGNOSIS — E538 Deficiency of other specified B group vitamins: Secondary | ICD-10-CM | POA: Diagnosis not present

## 2017-04-17 DIAGNOSIS — M2560 Stiffness of unspecified joint, not elsewhere classified: Secondary | ICD-10-CM | POA: Diagnosis not present

## 2017-04-17 DIAGNOSIS — R202 Paresthesia of skin: Secondary | ICD-10-CM | POA: Diagnosis not present

## 2017-04-17 DIAGNOSIS — R252 Cramp and spasm: Secondary | ICD-10-CM | POA: Diagnosis not present

## 2017-04-17 DIAGNOSIS — E538 Deficiency of other specified B group vitamins: Secondary | ICD-10-CM | POA: Diagnosis not present

## 2017-04-23 ENCOUNTER — Other Ambulatory Visit: Payer: Medicare Other

## 2017-04-23 ENCOUNTER — Ambulatory Visit
Admission: RE | Admit: 2017-04-23 | Discharge: 2017-04-23 | Disposition: A | Discharge status: Home / Self Care | Payer: Medicare Other | Source: Ambulatory Visit | Attending: Neurology | Admitting: Neurology

## 2017-04-23 DIAGNOSIS — R202 Paresthesia of skin: Secondary | ICD-10-CM

## 2017-04-24 ENCOUNTER — Telehealth: Payer: Self-pay | Admitting: Neurology

## 2017-04-24 NOTE — Telephone Encounter (Signed)
I called the patient.  MRI of the cervical spine was relatively unremarkable, unchanged from 2 years ago.  If the B12 supplementation has not improved the paresthesias, we may need to reevaluate the patient.  If the thyroid nodule is not a known entity, following this with thyroid ultrasound may be important.    MRI cervical 04/24/17:  IMPRESSION:   Abnormal MRI cervical spine (without) demonstrating: 1. At C5-6: disc bulging and uncovertebral joint hypertrophy with moderate biforaminal stenosis  2. Enlarged multinodular thyroid goiter with multiple cysts. Consider correlation with serum and ultrasound testing. 3. No change from MRI on 05/15/15.

## 2017-05-08 DIAGNOSIS — Z85828 Personal history of other malignant neoplasm of skin: Secondary | ICD-10-CM | POA: Diagnosis not present

## 2017-05-08 DIAGNOSIS — D229 Melanocytic nevi, unspecified: Secondary | ICD-10-CM | POA: Diagnosis not present

## 2017-05-15 DIAGNOSIS — E538 Deficiency of other specified B group vitamins: Secondary | ICD-10-CM | POA: Diagnosis not present

## 2017-06-17 DIAGNOSIS — E538 Deficiency of other specified B group vitamins: Secondary | ICD-10-CM | POA: Diagnosis not present

## 2017-07-03 ENCOUNTER — Other Ambulatory Visit: Payer: Self-pay | Admitting: Sports Medicine

## 2017-07-03 DIAGNOSIS — G8929 Other chronic pain: Secondary | ICD-10-CM

## 2017-07-03 DIAGNOSIS — M545 Low back pain: Principal | ICD-10-CM

## 2017-07-04 DIAGNOSIS — I83893 Varicose veins of bilateral lower extremities with other complications: Secondary | ICD-10-CM | POA: Diagnosis not present

## 2017-07-04 DIAGNOSIS — R202 Paresthesia of skin: Secondary | ICD-10-CM | POA: Diagnosis not present

## 2017-07-04 DIAGNOSIS — M79605 Pain in left leg: Secondary | ICD-10-CM | POA: Diagnosis not present

## 2017-07-04 DIAGNOSIS — M79604 Pain in right leg: Secondary | ICD-10-CM | POA: Diagnosis not present

## 2017-07-10 DIAGNOSIS — D510 Vitamin B12 deficiency anemia due to intrinsic factor deficiency: Secondary | ICD-10-CM | POA: Diagnosis not present

## 2017-07-12 DIAGNOSIS — Z6824 Body mass index (BMI) 24.0-24.9, adult: Secondary | ICD-10-CM | POA: Diagnosis not present

## 2017-07-12 DIAGNOSIS — N6312 Unspecified lump in the right breast, upper inner quadrant: Secondary | ICD-10-CM | POA: Diagnosis not present

## 2017-07-12 DIAGNOSIS — Z1231 Encounter for screening mammogram for malignant neoplasm of breast: Secondary | ICD-10-CM | POA: Diagnosis not present

## 2017-07-12 DIAGNOSIS — M810 Age-related osteoporosis without current pathological fracture: Secondary | ICD-10-CM | POA: Diagnosis not present

## 2017-07-12 DIAGNOSIS — Z01411 Encounter for gynecological examination (general) (routine) with abnormal findings: Secondary | ICD-10-CM | POA: Diagnosis not present

## 2017-07-15 DIAGNOSIS — N6312 Unspecified lump in the right breast, upper inner quadrant: Secondary | ICD-10-CM | POA: Diagnosis not present

## 2017-07-15 DIAGNOSIS — E559 Vitamin D deficiency, unspecified: Secondary | ICD-10-CM | POA: Diagnosis not present

## 2017-07-18 ENCOUNTER — Ambulatory Visit
Admission: RE | Admit: 2017-07-18 | Discharge: 2017-07-18 | Disposition: A | Discharge status: Home / Self Care | Payer: Medicare Other | Source: Ambulatory Visit | Attending: Sports Medicine | Admitting: Sports Medicine

## 2017-07-18 DIAGNOSIS — M545 Low back pain: Principal | ICD-10-CM

## 2017-07-18 DIAGNOSIS — M47816 Spondylosis without myelopathy or radiculopathy, lumbar region: Secondary | ICD-10-CM | POA: Diagnosis not present

## 2017-07-18 DIAGNOSIS — G8929 Other chronic pain: Secondary | ICD-10-CM

## 2017-07-18 MED ORDER — METHYLPREDNISOLONE ACETATE 40 MG/ML INJ SUSP (RADIOLOG
120.0000 mg | Freq: Once | INTRAMUSCULAR | Status: AC
  Administered 2017-07-18: 120 mg via EPIDURAL

## 2017-07-18 MED ORDER — IOPAMIDOL (ISOVUE-M 200) INJECTION 41%
1.0000 mL | Freq: Once | INTRAMUSCULAR | Status: AC
  Administered 2017-07-18: 1 mL via EPIDURAL

## 2017-07-18 NOTE — Discharge Instructions (Signed)

## 2017-07-19 DIAGNOSIS — T8549XA Other mechanical complication of breast prosthesis and implant, initial encounter: Secondary | ICD-10-CM | POA: Diagnosis not present

## 2017-07-19 DIAGNOSIS — E538 Deficiency of other specified B group vitamins: Secondary | ICD-10-CM | POA: Diagnosis not present

## 2017-07-19 DIAGNOSIS — M810 Age-related osteoporosis without current pathological fracture: Secondary | ICD-10-CM | POA: Diagnosis not present

## 2017-08-02 DIAGNOSIS — T8543XA Leakage of breast prosthesis and implant, initial encounter: Secondary | ICD-10-CM | POA: Diagnosis not present

## 2017-08-02 DIAGNOSIS — T8544XA Capsular contracture of breast implant, initial encounter: Secondary | ICD-10-CM | POA: Diagnosis not present

## 2017-08-12 DIAGNOSIS — D510 Vitamin B12 deficiency anemia due to intrinsic factor deficiency: Secondary | ICD-10-CM | POA: Diagnosis not present

## 2017-09-11 DIAGNOSIS — E559 Vitamin D deficiency, unspecified: Secondary | ICD-10-CM | POA: Diagnosis not present

## 2017-09-12 DIAGNOSIS — D510 Vitamin B12 deficiency anemia due to intrinsic factor deficiency: Secondary | ICD-10-CM | POA: Diagnosis not present

## 2017-09-13 DIAGNOSIS — T8543XD Leakage of breast prosthesis and implant, subsequent encounter: Secondary | ICD-10-CM | POA: Diagnosis not present

## 2017-10-03 ENCOUNTER — Other Ambulatory Visit: Payer: Self-pay | Admitting: Plastic Surgery

## 2017-10-03 DIAGNOSIS — T8543XA Leakage of breast prosthesis and implant, initial encounter: Secondary | ICD-10-CM | POA: Diagnosis not present

## 2017-10-03 DIAGNOSIS — T85898A Other specified complication of other internal prosthetic devices, implants and grafts, initial encounter: Secondary | ICD-10-CM | POA: Diagnosis not present

## 2017-10-14 DIAGNOSIS — D649 Anemia, unspecified: Secondary | ICD-10-CM | POA: Diagnosis not present

## 2017-11-15 DIAGNOSIS — E538 Deficiency of other specified B group vitamins: Secondary | ICD-10-CM | POA: Diagnosis not present

## 2017-12-06 DIAGNOSIS — Z23 Encounter for immunization: Secondary | ICD-10-CM | POA: Diagnosis not present

## 2017-12-24 DIAGNOSIS — Z136 Encounter for screening for cardiovascular disorders: Secondary | ICD-10-CM | POA: Diagnosis not present

## 2017-12-24 DIAGNOSIS — Z0001 Encounter for general adult medical examination with abnormal findings: Secondary | ICD-10-CM | POA: Diagnosis not present

## 2017-12-24 DIAGNOSIS — Z23 Encounter for immunization: Secondary | ICD-10-CM | POA: Diagnosis not present

## 2017-12-24 DIAGNOSIS — D509 Iron deficiency anemia, unspecified: Secondary | ICD-10-CM | POA: Diagnosis not present

## 2017-12-24 DIAGNOSIS — Z1389 Encounter for screening for other disorder: Secondary | ICD-10-CM | POA: Diagnosis not present

## 2017-12-24 DIAGNOSIS — M810 Age-related osteoporosis without current pathological fracture: Secondary | ICD-10-CM | POA: Diagnosis not present

## 2017-12-24 DIAGNOSIS — Z135 Encounter for screening for eye and ear disorders: Secondary | ICD-10-CM | POA: Diagnosis not present

## 2017-12-24 DIAGNOSIS — D510 Vitamin B12 deficiency anemia due to intrinsic factor deficiency: Secondary | ICD-10-CM | POA: Diagnosis not present

## 2017-12-31 DIAGNOSIS — L820 Inflamed seborrheic keratosis: Secondary | ICD-10-CM | POA: Diagnosis not present

## 2017-12-31 DIAGNOSIS — D1801 Hemangioma of skin and subcutaneous tissue: Secondary | ICD-10-CM | POA: Diagnosis not present

## 2017-12-31 DIAGNOSIS — R208 Other disturbances of skin sensation: Secondary | ICD-10-CM | POA: Diagnosis not present

## 2017-12-31 DIAGNOSIS — L814 Other melanin hyperpigmentation: Secondary | ICD-10-CM | POA: Diagnosis not present

## 2017-12-31 DIAGNOSIS — D229 Melanocytic nevi, unspecified: Secondary | ICD-10-CM | POA: Diagnosis not present

## 2018-01-27 DIAGNOSIS — D510 Vitamin B12 deficiency anemia due to intrinsic factor deficiency: Secondary | ICD-10-CM | POA: Diagnosis not present

## 2018-03-11 DIAGNOSIS — D510 Vitamin B12 deficiency anemia due to intrinsic factor deficiency: Secondary | ICD-10-CM | POA: Diagnosis not present

## 2018-04-03 DIAGNOSIS — I8312 Varicose veins of left lower extremity with inflammation: Secondary | ICD-10-CM | POA: Diagnosis not present

## 2018-04-03 DIAGNOSIS — I83813 Varicose veins of bilateral lower extremities with pain: Secondary | ICD-10-CM | POA: Diagnosis not present

## 2018-04-03 DIAGNOSIS — I8311 Varicose veins of right lower extremity with inflammation: Secondary | ICD-10-CM | POA: Diagnosis not present

## 2018-04-21 DIAGNOSIS — D510 Vitamin B12 deficiency anemia due to intrinsic factor deficiency: Secondary | ICD-10-CM | POA: Diagnosis not present

## 2018-05-27 DIAGNOSIS — I83891 Varicose veins of right lower extremities with other complications: Secondary | ICD-10-CM | POA: Diagnosis not present

## 2018-05-27 DIAGNOSIS — I8311 Varicose veins of right lower extremity with inflammation: Secondary | ICD-10-CM | POA: Diagnosis not present

## 2018-05-29 DIAGNOSIS — I8311 Varicose veins of right lower extremity with inflammation: Secondary | ICD-10-CM | POA: Diagnosis not present

## 2018-06-11 DIAGNOSIS — D510 Vitamin B12 deficiency anemia due to intrinsic factor deficiency: Secondary | ICD-10-CM | POA: Diagnosis not present

## 2018-07-17 DIAGNOSIS — D510 Vitamin B12 deficiency anemia due to intrinsic factor deficiency: Secondary | ICD-10-CM | POA: Diagnosis not present

## 2018-07-18 DIAGNOSIS — M7981 Nontraumatic hematoma of soft tissue: Secondary | ICD-10-CM | POA: Diagnosis not present

## 2018-07-18 DIAGNOSIS — I8311 Varicose veins of right lower extremity with inflammation: Secondary | ICD-10-CM | POA: Diagnosis not present

## 2018-08-01 DIAGNOSIS — I83811 Varicose veins of right lower extremities with pain: Secondary | ICD-10-CM | POA: Diagnosis not present

## 2018-08-01 DIAGNOSIS — I8311 Varicose veins of right lower extremity with inflammation: Secondary | ICD-10-CM | POA: Diagnosis not present

## 2018-08-15 DIAGNOSIS — I83812 Varicose veins of left lower extremities with pain: Secondary | ICD-10-CM | POA: Diagnosis not present

## 2018-08-15 DIAGNOSIS — I8312 Varicose veins of left lower extremity with inflammation: Secondary | ICD-10-CM | POA: Diagnosis not present

## 2018-08-15 DIAGNOSIS — M7981 Nontraumatic hematoma of soft tissue: Secondary | ICD-10-CM | POA: Diagnosis not present

## 2018-08-21 DIAGNOSIS — E538 Deficiency of other specified B group vitamins: Secondary | ICD-10-CM | POA: Diagnosis not present

## 2018-08-29 DIAGNOSIS — I8312 Varicose veins of left lower extremity with inflammation: Secondary | ICD-10-CM | POA: Diagnosis not present

## 2018-08-29 DIAGNOSIS — M7981 Nontraumatic hematoma of soft tissue: Secondary | ICD-10-CM | POA: Diagnosis not present

## 2018-08-29 DIAGNOSIS — I83812 Varicose veins of left lower extremities with pain: Secondary | ICD-10-CM | POA: Diagnosis not present

## 2018-09-12 DIAGNOSIS — M7981 Nontraumatic hematoma of soft tissue: Secondary | ICD-10-CM | POA: Diagnosis not present

## 2018-09-12 DIAGNOSIS — I8312 Varicose veins of left lower extremity with inflammation: Secondary | ICD-10-CM | POA: Diagnosis not present

## 2018-09-24 DIAGNOSIS — E538 Deficiency of other specified B group vitamins: Secondary | ICD-10-CM | POA: Diagnosis not present

## 2018-10-16 DIAGNOSIS — R0981 Nasal congestion: Secondary | ICD-10-CM | POA: Diagnosis not present

## 2018-10-16 DIAGNOSIS — R42 Dizziness and giddiness: Secondary | ICD-10-CM | POA: Diagnosis not present

## 2018-11-06 DIAGNOSIS — D510 Vitamin B12 deficiency anemia due to intrinsic factor deficiency: Secondary | ICD-10-CM | POA: Diagnosis not present

## 2018-12-12 DIAGNOSIS — Z23 Encounter for immunization: Secondary | ICD-10-CM | POA: Diagnosis not present

## 2018-12-12 DIAGNOSIS — M810 Age-related osteoporosis without current pathological fracture: Secondary | ICD-10-CM | POA: Diagnosis not present

## 2018-12-12 DIAGNOSIS — D510 Vitamin B12 deficiency anemia due to intrinsic factor deficiency: Secondary | ICD-10-CM | POA: Diagnosis not present

## 2019-01-23 DIAGNOSIS — M810 Age-related osteoporosis without current pathological fracture: Secondary | ICD-10-CM | POA: Diagnosis not present

## 2019-01-27 DIAGNOSIS — E538 Deficiency of other specified B group vitamins: Secondary | ICD-10-CM | POA: Diagnosis not present

## 2019-04-09 DIAGNOSIS — H5203 Hypermetropia, bilateral: Secondary | ICD-10-CM | POA: Diagnosis not present

## 2019-04-09 DIAGNOSIS — H2513 Age-related nuclear cataract, bilateral: Secondary | ICD-10-CM | POA: Diagnosis not present

## 2019-04-10 DIAGNOSIS — D510 Vitamin B12 deficiency anemia due to intrinsic factor deficiency: Secondary | ICD-10-CM | POA: Diagnosis not present

## 2019-04-10 DIAGNOSIS — R7989 Other specified abnormal findings of blood chemistry: Secondary | ICD-10-CM | POA: Diagnosis not present

## 2019-05-04 DIAGNOSIS — Z23 Encounter for immunization: Secondary | ICD-10-CM | POA: Diagnosis not present

## 2019-05-15 DIAGNOSIS — D510 Vitamin B12 deficiency anemia due to intrinsic factor deficiency: Secondary | ICD-10-CM | POA: Diagnosis not present

## 2019-05-28 DIAGNOSIS — I8312 Varicose veins of left lower extremity with inflammation: Secondary | ICD-10-CM | POA: Diagnosis not present

## 2019-05-28 DIAGNOSIS — I83813 Varicose veins of bilateral lower extremities with pain: Secondary | ICD-10-CM | POA: Diagnosis not present

## 2019-05-28 DIAGNOSIS — I8311 Varicose veins of right lower extremity with inflammation: Secondary | ICD-10-CM | POA: Diagnosis not present

## 2019-06-11 DIAGNOSIS — I83813 Varicose veins of bilateral lower extremities with pain: Secondary | ICD-10-CM | POA: Diagnosis not present

## 2019-06-11 DIAGNOSIS — I8311 Varicose veins of right lower extremity with inflammation: Secondary | ICD-10-CM | POA: Diagnosis not present

## 2019-06-11 DIAGNOSIS — I8312 Varicose veins of left lower extremity with inflammation: Secondary | ICD-10-CM | POA: Diagnosis not present

## 2019-06-19 DIAGNOSIS — D510 Vitamin B12 deficiency anemia due to intrinsic factor deficiency: Secondary | ICD-10-CM | POA: Diagnosis not present

## 2019-06-30 DIAGNOSIS — H2511 Age-related nuclear cataract, right eye: Secondary | ICD-10-CM | POA: Diagnosis not present

## 2019-06-30 DIAGNOSIS — H25811 Combined forms of age-related cataract, right eye: Secondary | ICD-10-CM | POA: Diagnosis not present

## 2019-07-03 DIAGNOSIS — I8312 Varicose veins of left lower extremity with inflammation: Secondary | ICD-10-CM | POA: Diagnosis not present

## 2019-07-03 DIAGNOSIS — I83892 Varicose veins of left lower extremities with other complications: Secondary | ICD-10-CM | POA: Diagnosis not present

## 2019-07-08 DIAGNOSIS — M79604 Pain in right leg: Secondary | ICD-10-CM | POA: Diagnosis not present

## 2019-07-08 DIAGNOSIS — I8312 Varicose veins of left lower extremity with inflammation: Secondary | ICD-10-CM | POA: Diagnosis not present

## 2019-07-08 DIAGNOSIS — I83812 Varicose veins of left lower extremities with pain: Secondary | ICD-10-CM | POA: Diagnosis not present

## 2019-07-08 DIAGNOSIS — M7981 Nontraumatic hematoma of soft tissue: Secondary | ICD-10-CM | POA: Diagnosis not present

## 2019-07-08 DIAGNOSIS — M79652 Pain in left thigh: Secondary | ICD-10-CM | POA: Diagnosis not present

## 2019-07-14 DIAGNOSIS — H2512 Age-related nuclear cataract, left eye: Secondary | ICD-10-CM | POA: Diagnosis not present

## 2019-07-14 DIAGNOSIS — H25812 Combined forms of age-related cataract, left eye: Secondary | ICD-10-CM | POA: Diagnosis not present

## 2019-07-16 DIAGNOSIS — M79652 Pain in left thigh: Secondary | ICD-10-CM | POA: Diagnosis not present

## 2019-07-16 DIAGNOSIS — I83812 Varicose veins of left lower extremities with pain: Secondary | ICD-10-CM | POA: Diagnosis not present

## 2019-07-16 DIAGNOSIS — M79604 Pain in right leg: Secondary | ICD-10-CM | POA: Diagnosis not present

## 2019-07-16 DIAGNOSIS — M7981 Nontraumatic hematoma of soft tissue: Secondary | ICD-10-CM | POA: Diagnosis not present

## 2019-07-16 DIAGNOSIS — I8312 Varicose veins of left lower extremity with inflammation: Secondary | ICD-10-CM | POA: Diagnosis not present

## 2019-07-21 DIAGNOSIS — D510 Vitamin B12 deficiency anemia due to intrinsic factor deficiency: Secondary | ICD-10-CM | POA: Diagnosis not present

## 2019-08-11 DIAGNOSIS — M810 Age-related osteoporosis without current pathological fracture: Secondary | ICD-10-CM | POA: Diagnosis not present

## 2019-08-11 DIAGNOSIS — Z6825 Body mass index (BMI) 25.0-25.9, adult: Secondary | ICD-10-CM | POA: Diagnosis not present

## 2019-08-11 DIAGNOSIS — N898 Other specified noninflammatory disorders of vagina: Secondary | ICD-10-CM | POA: Diagnosis not present

## 2019-08-11 DIAGNOSIS — Z1211 Encounter for screening for malignant neoplasm of colon: Secondary | ICD-10-CM | POA: Diagnosis not present

## 2019-08-11 DIAGNOSIS — Z01419 Encounter for gynecological examination (general) (routine) without abnormal findings: Secondary | ICD-10-CM | POA: Diagnosis not present

## 2019-08-11 DIAGNOSIS — N9489 Other specified conditions associated with female genital organs and menstrual cycle: Secondary | ICD-10-CM | POA: Diagnosis not present

## 2019-08-11 DIAGNOSIS — Z1239 Encounter for other screening for malignant neoplasm of breast: Secondary | ICD-10-CM | POA: Diagnosis not present

## 2019-08-11 DIAGNOSIS — Z1231 Encounter for screening mammogram for malignant neoplasm of breast: Secondary | ICD-10-CM | POA: Diagnosis not present

## 2019-08-13 DIAGNOSIS — M545 Low back pain: Secondary | ICD-10-CM | POA: Diagnosis not present

## 2019-08-13 DIAGNOSIS — R194 Change in bowel habit: Secondary | ICD-10-CM | POA: Diagnosis not present

## 2019-08-13 DIAGNOSIS — M810 Age-related osteoporosis without current pathological fracture: Secondary | ICD-10-CM | POA: Diagnosis not present

## 2019-08-13 DIAGNOSIS — N9489 Other specified conditions associated with female genital organs and menstrual cycle: Secondary | ICD-10-CM | POA: Diagnosis not present

## 2019-08-18 ENCOUNTER — Other Ambulatory Visit: Payer: Self-pay | Admitting: Internal Medicine

## 2019-08-18 ENCOUNTER — Ambulatory Visit
Admission: RE | Admit: 2019-08-18 | Discharge: 2019-08-18 | Disposition: A | Discharge status: Home / Self Care | Payer: Medicare Other | Source: Ambulatory Visit | Attending: Internal Medicine | Admitting: Internal Medicine

## 2019-08-18 DIAGNOSIS — R1031 Right lower quadrant pain: Secondary | ICD-10-CM

## 2019-08-18 DIAGNOSIS — D510 Vitamin B12 deficiency anemia due to intrinsic factor deficiency: Secondary | ICD-10-CM | POA: Diagnosis not present

## 2019-08-18 DIAGNOSIS — M810 Age-related osteoporosis without current pathological fracture: Secondary | ICD-10-CM | POA: Diagnosis not present

## 2019-09-21 DIAGNOSIS — E538 Deficiency of other specified B group vitamins: Secondary | ICD-10-CM | POA: Diagnosis not present

## 2019-09-25 DIAGNOSIS — L509 Urticaria, unspecified: Secondary | ICD-10-CM | POA: Diagnosis not present

## 2019-10-08 DIAGNOSIS — M810 Age-related osteoporosis without current pathological fracture: Secondary | ICD-10-CM | POA: Diagnosis not present

## 2019-10-14 DIAGNOSIS — R05 Cough: Secondary | ICD-10-CM | POA: Diagnosis not present

## 2019-10-23 DIAGNOSIS — E538 Deficiency of other specified B group vitamins: Secondary | ICD-10-CM | POA: Diagnosis not present

## 2019-11-24 DIAGNOSIS — D510 Vitamin B12 deficiency anemia due to intrinsic factor deficiency: Secondary | ICD-10-CM | POA: Diagnosis not present

## 2019-12-08 DIAGNOSIS — E538 Deficiency of other specified B group vitamins: Secondary | ICD-10-CM | POA: Diagnosis not present

## 2019-12-08 DIAGNOSIS — E559 Vitamin D deficiency, unspecified: Secondary | ICD-10-CM | POA: Diagnosis not present

## 2019-12-08 DIAGNOSIS — Z23 Encounter for immunization: Secondary | ICD-10-CM | POA: Diagnosis not present

## 2019-12-08 DIAGNOSIS — M810 Age-related osteoporosis without current pathological fracture: Secondary | ICD-10-CM | POA: Diagnosis not present

## 2019-12-08 DIAGNOSIS — R252 Cramp and spasm: Secondary | ICD-10-CM | POA: Diagnosis not present

## 2019-12-29 DIAGNOSIS — D510 Vitamin B12 deficiency anemia due to intrinsic factor deficiency: Secondary | ICD-10-CM | POA: Diagnosis not present

## 2020-01-22 DIAGNOSIS — Z23 Encounter for immunization: Secondary | ICD-10-CM | POA: Diagnosis not present

## 2020-02-01 DIAGNOSIS — E538 Deficiency of other specified B group vitamins: Secondary | ICD-10-CM | POA: Diagnosis not present

## 2020-02-18 DIAGNOSIS — Z0000 Encounter for general adult medical examination without abnormal findings: Secondary | ICD-10-CM | POA: Diagnosis not present

## 2020-02-18 DIAGNOSIS — M810 Age-related osteoporosis without current pathological fracture: Secondary | ICD-10-CM | POA: Diagnosis not present

## 2020-02-18 DIAGNOSIS — E538 Deficiency of other specified B group vitamins: Secondary | ICD-10-CM | POA: Diagnosis not present

## 2020-02-18 DIAGNOSIS — G4700 Insomnia, unspecified: Secondary | ICD-10-CM | POA: Diagnosis not present

## 2020-02-18 DIAGNOSIS — E7800 Pure hypercholesterolemia, unspecified: Secondary | ICD-10-CM | POA: Diagnosis not present

## 2020-02-18 DIAGNOSIS — Z1389 Encounter for screening for other disorder: Secondary | ICD-10-CM | POA: Diagnosis not present

## 2020-02-18 DIAGNOSIS — E559 Vitamin D deficiency, unspecified: Secondary | ICD-10-CM | POA: Diagnosis not present

## 2020-03-02 DIAGNOSIS — E538 Deficiency of other specified B group vitamins: Secondary | ICD-10-CM | POA: Diagnosis not present

## 2020-04-04 DIAGNOSIS — D510 Vitamin B12 deficiency anemia due to intrinsic factor deficiency: Secondary | ICD-10-CM | POA: Diagnosis not present

## 2020-05-05 DIAGNOSIS — D510 Vitamin B12 deficiency anemia due to intrinsic factor deficiency: Secondary | ICD-10-CM | POA: Diagnosis not present

## 2020-06-09 DIAGNOSIS — D510 Vitamin B12 deficiency anemia due to intrinsic factor deficiency: Secondary | ICD-10-CM | POA: Diagnosis not present

## 2020-06-25 DIAGNOSIS — Z23 Encounter for immunization: Secondary | ICD-10-CM | POA: Diagnosis not present

## 2020-07-06 DIAGNOSIS — Z961 Presence of intraocular lens: Secondary | ICD-10-CM | POA: Diagnosis not present

## 2020-07-12 DIAGNOSIS — D510 Vitamin B12 deficiency anemia due to intrinsic factor deficiency: Secondary | ICD-10-CM | POA: Diagnosis not present

## 2020-08-05 DIAGNOSIS — D510 Vitamin B12 deficiency anemia due to intrinsic factor deficiency: Secondary | ICD-10-CM | POA: Diagnosis not present

## 2020-08-16 DIAGNOSIS — L538 Other specified erythematous conditions: Secondary | ICD-10-CM | POA: Diagnosis not present

## 2020-08-16 DIAGNOSIS — L820 Inflamed seborrheic keratosis: Secondary | ICD-10-CM | POA: Diagnosis not present

## 2020-08-16 DIAGNOSIS — L298 Other pruritus: Secondary | ICD-10-CM | POA: Diagnosis not present

## 2020-08-16 DIAGNOSIS — R208 Other disturbances of skin sensation: Secondary | ICD-10-CM | POA: Diagnosis not present

## 2020-09-08 DIAGNOSIS — E538 Deficiency of other specified B group vitamins: Secondary | ICD-10-CM | POA: Diagnosis not present

## 2020-10-11 DIAGNOSIS — E538 Deficiency of other specified B group vitamins: Secondary | ICD-10-CM | POA: Diagnosis not present

## 2020-10-20 DIAGNOSIS — H353132 Nonexudative age-related macular degeneration, bilateral, intermediate dry stage: Secondary | ICD-10-CM | POA: Diagnosis not present

## 2020-10-20 DIAGNOSIS — Z961 Presence of intraocular lens: Secondary | ICD-10-CM | POA: Diagnosis not present

## 2020-11-10 DIAGNOSIS — D510 Vitamin B12 deficiency anemia due to intrinsic factor deficiency: Secondary | ICD-10-CM | POA: Diagnosis not present

## 2020-12-09 DIAGNOSIS — Z23 Encounter for immunization: Secondary | ICD-10-CM | POA: Diagnosis not present

## 2020-12-12 DIAGNOSIS — Z23 Encounter for immunization: Secondary | ICD-10-CM | POA: Diagnosis not present

## 2020-12-12 DIAGNOSIS — E538 Deficiency of other specified B group vitamins: Secondary | ICD-10-CM | POA: Diagnosis not present

## 2020-12-13 DIAGNOSIS — I83893 Varicose veins of bilateral lower extremities with other complications: Secondary | ICD-10-CM | POA: Diagnosis not present

## 2020-12-13 DIAGNOSIS — I83813 Varicose veins of bilateral lower extremities with pain: Secondary | ICD-10-CM | POA: Diagnosis not present

## 2020-12-13 DIAGNOSIS — I8311 Varicose veins of right lower extremity with inflammation: Secondary | ICD-10-CM | POA: Diagnosis not present

## 2020-12-13 DIAGNOSIS — I8312 Varicose veins of left lower extremity with inflammation: Secondary | ICD-10-CM | POA: Diagnosis not present

## 2020-12-14 DIAGNOSIS — I8312 Varicose veins of left lower extremity with inflammation: Secondary | ICD-10-CM | POA: Diagnosis not present

## 2020-12-14 DIAGNOSIS — I83812 Varicose veins of left lower extremities with pain: Secondary | ICD-10-CM | POA: Diagnosis not present

## 2020-12-15 DIAGNOSIS — I8311 Varicose veins of right lower extremity with inflammation: Secondary | ICD-10-CM | POA: Diagnosis not present

## 2020-12-20 DIAGNOSIS — I8312 Varicose veins of left lower extremity with inflammation: Secondary | ICD-10-CM | POA: Diagnosis not present

## 2020-12-20 DIAGNOSIS — I83892 Varicose veins of left lower extremities with other complications: Secondary | ICD-10-CM | POA: Diagnosis not present

## 2020-12-25 ENCOUNTER — Other Ambulatory Visit: Payer: Self-pay

## 2020-12-25 ENCOUNTER — Emergency Department (HOSPITAL_BASED_OUTPATIENT_CLINIC_OR_DEPARTMENT_OTHER): Payer: Medicare Other | Admitting: Radiology

## 2020-12-25 ENCOUNTER — Emergency Department (HOSPITAL_BASED_OUTPATIENT_CLINIC_OR_DEPARTMENT_OTHER)
Admission: EM | Admit: 2020-12-25 | Discharge: 2020-12-25 | Disposition: A | Discharge status: Home / Self Care | Payer: Medicare Other | Attending: Emergency Medicine | Admitting: Emergency Medicine

## 2020-12-25 ENCOUNTER — Encounter (HOSPITAL_BASED_OUTPATIENT_CLINIC_OR_DEPARTMENT_OTHER): Payer: Self-pay | Admitting: Obstetrics and Gynecology

## 2020-12-25 DIAGNOSIS — X501XXA Overexertion from prolonged static or awkward postures, initial encounter: Secondary | ICD-10-CM | POA: Insufficient documentation

## 2020-12-25 DIAGNOSIS — S92302A Fracture of unspecified metatarsal bone(s), left foot, initial encounter for closed fracture: Secondary | ICD-10-CM | POA: Diagnosis not present

## 2020-12-25 DIAGNOSIS — S92902A Unspecified fracture of left foot, initial encounter for closed fracture: Secondary | ICD-10-CM | POA: Insufficient documentation

## 2020-12-25 DIAGNOSIS — Z85828 Personal history of other malignant neoplasm of skin: Secondary | ICD-10-CM | POA: Diagnosis not present

## 2020-12-25 DIAGNOSIS — M7989 Other specified soft tissue disorders: Secondary | ICD-10-CM | POA: Diagnosis not present

## 2020-12-25 DIAGNOSIS — S99912A Unspecified injury of left ankle, initial encounter: Secondary | ICD-10-CM | POA: Diagnosis present

## 2020-12-25 DIAGNOSIS — S93602A Unspecified sprain of left foot, initial encounter: Secondary | ICD-10-CM | POA: Diagnosis not present

## 2020-12-25 DIAGNOSIS — M25572 Pain in left ankle and joints of left foot: Secondary | ICD-10-CM | POA: Diagnosis not present

## 2020-12-25 NOTE — Discharge Instructions (Signed)
Overall suspect you have a bad foot sprain.  There is a small fracture where I suspect the ligament was attached to your bone in your foot.  Wear walking boot at all times.  Can take off when you are not walking or for shower.  Continue ice, Tylenol, ibuprofen.  Follow-up with orthopedics.

## 2020-12-25 NOTE — ED Triage Notes (Signed)
Patient reports her foot fell asleep and she went to take a step on it and it twisted in her shoe and bent in half. Noted swelling and bruising to left foot.
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
## 2020-12-26 DIAGNOSIS — I8311 Varicose veins of right lower extremity with inflammation: Secondary | ICD-10-CM | POA: Diagnosis not present

## 2021-01-06 DIAGNOSIS — M79672 Pain in left foot: Secondary | ICD-10-CM | POA: Diagnosis not present

## 2021-01-09 DIAGNOSIS — M7989 Other specified soft tissue disorders: Secondary | ICD-10-CM | POA: Diagnosis not present

## 2021-01-09 DIAGNOSIS — I8003 Phlebitis and thrombophlebitis of superficial vessels of lower extremities, bilateral: Secondary | ICD-10-CM | POA: Diagnosis not present

## 2021-01-09 DIAGNOSIS — I83813 Varicose veins of bilateral lower extremities with pain: Secondary | ICD-10-CM | POA: Diagnosis not present

## 2021-01-10 DIAGNOSIS — D510 Vitamin B12 deficiency anemia due to intrinsic factor deficiency: Secondary | ICD-10-CM | POA: Diagnosis not present

## 2021-02-06 DIAGNOSIS — M79672 Pain in left foot: Secondary | ICD-10-CM | POA: Diagnosis not present

## 2021-02-06 DIAGNOSIS — S92355D Nondisplaced fracture of fifth metatarsal bone, left foot, subsequent encounter for fracture with routine healing: Secondary | ICD-10-CM | POA: Diagnosis not present

## 2021-02-10 DIAGNOSIS — E538 Deficiency of other specified B group vitamins: Secondary | ICD-10-CM | POA: Diagnosis not present

## 2021-03-08 DIAGNOSIS — M79672 Pain in left foot: Secondary | ICD-10-CM | POA: Diagnosis not present

## 2021-03-08 DIAGNOSIS — S92355D Nondisplaced fracture of fifth metatarsal bone, left foot, subsequent encounter for fracture with routine healing: Secondary | ICD-10-CM | POA: Diagnosis not present

## 2021-03-14 DIAGNOSIS — E538 Deficiency of other specified B group vitamins: Secondary | ICD-10-CM | POA: Diagnosis not present

## 2021-03-16 DIAGNOSIS — I83892 Varicose veins of left lower extremities with other complications: Secondary | ICD-10-CM | POA: Diagnosis not present

## 2021-03-16 DIAGNOSIS — I83812 Varicose veins of left lower extremities with pain: Secondary | ICD-10-CM | POA: Diagnosis not present

## 2021-04-17 DIAGNOSIS — D510 Vitamin B12 deficiency anemia due to intrinsic factor deficiency: Secondary | ICD-10-CM | POA: Diagnosis not present

## 2021-04-25 DIAGNOSIS — Z1231 Encounter for screening mammogram for malignant neoplasm of breast: Secondary | ICD-10-CM | POA: Diagnosis not present

## 2021-04-25 DIAGNOSIS — M810 Age-related osteoporosis without current pathological fracture: Secondary | ICD-10-CM | POA: Diagnosis not present

## 2021-04-25 DIAGNOSIS — Z1211 Encounter for screening for malignant neoplasm of colon: Secondary | ICD-10-CM | POA: Diagnosis not present

## 2021-04-25 DIAGNOSIS — Z01419 Encounter for gynecological examination (general) (routine) without abnormal findings: Secondary | ICD-10-CM | POA: Diagnosis not present

## 2021-04-25 DIAGNOSIS — Z6825 Body mass index (BMI) 25.0-25.9, adult: Secondary | ICD-10-CM | POA: Diagnosis not present

## 2021-04-25 DIAGNOSIS — N904 Leukoplakia of vulva: Secondary | ICD-10-CM | POA: Diagnosis not present

## 2021-04-26 DIAGNOSIS — I83892 Varicose veins of left lower extremities with other complications: Secondary | ICD-10-CM | POA: Diagnosis not present

## 2021-04-26 DIAGNOSIS — I87392 Chronic venous hypertension (idiopathic) with other complications of left lower extremity: Secondary | ICD-10-CM | POA: Diagnosis not present

## 2021-05-03 DIAGNOSIS — I83811 Varicose veins of right lower extremities with pain: Secondary | ICD-10-CM | POA: Diagnosis not present

## 2021-05-03 DIAGNOSIS — M7989 Other specified soft tissue disorders: Secondary | ICD-10-CM | POA: Diagnosis not present

## 2021-05-03 DIAGNOSIS — I83891 Varicose veins of right lower extremities with other complications: Secondary | ICD-10-CM | POA: Diagnosis not present

## 2021-05-09 DIAGNOSIS — N904 Leukoplakia of vulva: Secondary | ICD-10-CM | POA: Diagnosis not present

## 2021-05-10 DIAGNOSIS — I83892 Varicose veins of left lower extremities with other complications: Secondary | ICD-10-CM | POA: Diagnosis not present

## 2021-05-10 DIAGNOSIS — I87392 Chronic venous hypertension (idiopathic) with other complications of left lower extremity: Secondary | ICD-10-CM | POA: Diagnosis not present

## 2021-05-16 DIAGNOSIS — D510 Vitamin B12 deficiency anemia due to intrinsic factor deficiency: Secondary | ICD-10-CM | POA: Diagnosis not present

## 2021-05-17 DIAGNOSIS — H353211 Exudative age-related macular degeneration, right eye, with active choroidal neovascularization: Secondary | ICD-10-CM | POA: Diagnosis not present

## 2021-05-17 DIAGNOSIS — Z961 Presence of intraocular lens: Secondary | ICD-10-CM | POA: Diagnosis not present

## 2021-05-17 DIAGNOSIS — H26493 Other secondary cataract, bilateral: Secondary | ICD-10-CM | POA: Diagnosis not present

## 2021-05-22 DIAGNOSIS — Z23 Encounter for immunization: Secondary | ICD-10-CM | POA: Diagnosis not present

## 2021-05-22 DIAGNOSIS — E538 Deficiency of other specified B group vitamins: Secondary | ICD-10-CM | POA: Diagnosis not present

## 2021-05-22 DIAGNOSIS — I868 Varicose veins of other specified sites: Secondary | ICD-10-CM | POA: Diagnosis not present

## 2021-05-22 DIAGNOSIS — M810 Age-related osteoporosis without current pathological fracture: Secondary | ICD-10-CM | POA: Diagnosis not present

## 2021-05-22 DIAGNOSIS — F322 Major depressive disorder, single episode, severe without psychotic features: Secondary | ICD-10-CM | POA: Diagnosis not present

## 2021-05-22 DIAGNOSIS — Z5181 Encounter for therapeutic drug level monitoring: Secondary | ICD-10-CM | POA: Diagnosis not present

## 2021-05-22 DIAGNOSIS — Z0000 Encounter for general adult medical examination without abnormal findings: Secondary | ICD-10-CM | POA: Diagnosis not present

## 2021-05-22 DIAGNOSIS — G250 Essential tremor: Secondary | ICD-10-CM | POA: Diagnosis not present

## 2021-05-22 DIAGNOSIS — E7800 Pure hypercholesterolemia, unspecified: Secondary | ICD-10-CM | POA: Diagnosis not present

## 2021-05-29 DIAGNOSIS — H353211 Exudative age-related macular degeneration, right eye, with active choroidal neovascularization: Secondary | ICD-10-CM | POA: Diagnosis not present

## 2021-05-29 DIAGNOSIS — H353122 Nonexudative age-related macular degeneration, left eye, intermediate dry stage: Secondary | ICD-10-CM | POA: Diagnosis not present

## 2021-05-29 DIAGNOSIS — H43813 Vitreous degeneration, bilateral: Secondary | ICD-10-CM | POA: Diagnosis not present

## 2021-06-05 DIAGNOSIS — H353211 Exudative age-related macular degeneration, right eye, with active choroidal neovascularization: Secondary | ICD-10-CM | POA: Diagnosis not present

## 2021-06-07 DIAGNOSIS — L900 Lichen sclerosus et atrophicus: Secondary | ICD-10-CM | POA: Diagnosis not present

## 2021-06-20 DIAGNOSIS — E538 Deficiency of other specified B group vitamins: Secondary | ICD-10-CM | POA: Diagnosis not present

## 2021-06-22 DIAGNOSIS — I83811 Varicose veins of right lower extremities with pain: Secondary | ICD-10-CM | POA: Diagnosis not present

## 2021-06-22 DIAGNOSIS — R600 Localized edema: Secondary | ICD-10-CM | POA: Diagnosis not present

## 2021-06-22 DIAGNOSIS — I872 Venous insufficiency (chronic) (peripheral): Secondary | ICD-10-CM | POA: Diagnosis not present

## 2021-06-23 DIAGNOSIS — R635 Abnormal weight gain: Secondary | ICD-10-CM | POA: Diagnosis not present

## 2021-06-27 DIAGNOSIS — N951 Menopausal and female climacteric states: Secondary | ICD-10-CM | POA: Diagnosis not present

## 2021-06-27 DIAGNOSIS — E7800 Pure hypercholesterolemia, unspecified: Secondary | ICD-10-CM | POA: Diagnosis not present

## 2021-06-27 DIAGNOSIS — E063 Autoimmune thyroiditis: Secondary | ICD-10-CM | POA: Diagnosis not present

## 2021-06-27 DIAGNOSIS — R635 Abnormal weight gain: Secondary | ICD-10-CM | POA: Diagnosis not present

## 2021-07-05 DIAGNOSIS — R635 Abnormal weight gain: Secondary | ICD-10-CM | POA: Diagnosis not present

## 2021-07-05 DIAGNOSIS — Z1331 Encounter for screening for depression: Secondary | ICD-10-CM | POA: Diagnosis not present

## 2021-07-05 DIAGNOSIS — N951 Menopausal and female climacteric states: Secondary | ICD-10-CM | POA: Diagnosis not present

## 2021-07-05 DIAGNOSIS — E7800 Pure hypercholesterolemia, unspecified: Secondary | ICD-10-CM | POA: Diagnosis not present

## 2021-07-05 DIAGNOSIS — Z1339 Encounter for screening examination for other mental health and behavioral disorders: Secondary | ICD-10-CM | POA: Diagnosis not present

## 2021-07-05 DIAGNOSIS — E063 Autoimmune thyroiditis: Secondary | ICD-10-CM | POA: Diagnosis not present

## 2021-07-05 DIAGNOSIS — Z6824 Body mass index (BMI) 24.0-24.9, adult: Secondary | ICD-10-CM | POA: Diagnosis not present

## 2021-07-11 DIAGNOSIS — H43813 Vitreous degeneration, bilateral: Secondary | ICD-10-CM | POA: Diagnosis not present

## 2021-07-11 DIAGNOSIS — H353122 Nonexudative age-related macular degeneration, left eye, intermediate dry stage: Secondary | ICD-10-CM | POA: Diagnosis not present

## 2021-07-11 DIAGNOSIS — H353211 Exudative age-related macular degeneration, right eye, with active choroidal neovascularization: Secondary | ICD-10-CM | POA: Diagnosis not present

## 2021-07-21 DIAGNOSIS — E538 Deficiency of other specified B group vitamins: Secondary | ICD-10-CM | POA: Diagnosis not present

## 2021-08-02 DIAGNOSIS — E063 Autoimmune thyroiditis: Secondary | ICD-10-CM | POA: Diagnosis not present

## 2021-08-02 DIAGNOSIS — Z6824 Body mass index (BMI) 24.0-24.9, adult: Secondary | ICD-10-CM | POA: Diagnosis not present

## 2021-08-14 DIAGNOSIS — Z6824 Body mass index (BMI) 24.0-24.9, adult: Secondary | ICD-10-CM | POA: Diagnosis not present

## 2021-08-14 DIAGNOSIS — E7800 Pure hypercholesterolemia, unspecified: Secondary | ICD-10-CM | POA: Diagnosis not present

## 2021-08-15 DIAGNOSIS — H43813 Vitreous degeneration, bilateral: Secondary | ICD-10-CM | POA: Diagnosis not present

## 2021-08-15 DIAGNOSIS — H353122 Nonexudative age-related macular degeneration, left eye, intermediate dry stage: Secondary | ICD-10-CM | POA: Diagnosis not present

## 2021-08-15 DIAGNOSIS — H353211 Exudative age-related macular degeneration, right eye, with active choroidal neovascularization: Secondary | ICD-10-CM | POA: Diagnosis not present

## 2021-08-16 DIAGNOSIS — E538 Deficiency of other specified B group vitamins: Secondary | ICD-10-CM | POA: Diagnosis not present

## 2021-08-30 DIAGNOSIS — Z7189 Other specified counseling: Secondary | ICD-10-CM | POA: Diagnosis not present

## 2021-08-30 DIAGNOSIS — D485 Neoplasm of uncertain behavior of skin: Secondary | ICD-10-CM | POA: Diagnosis not present

## 2021-08-30 DIAGNOSIS — L814 Other melanin hyperpigmentation: Secondary | ICD-10-CM | POA: Diagnosis not present

## 2021-08-30 DIAGNOSIS — D2372 Other benign neoplasm of skin of left lower limb, including hip: Secondary | ICD-10-CM | POA: Diagnosis not present

## 2021-08-30 DIAGNOSIS — L821 Other seborrheic keratosis: Secondary | ICD-10-CM | POA: Diagnosis not present

## 2021-09-12 DIAGNOSIS — H353122 Nonexudative age-related macular degeneration, left eye, intermediate dry stage: Secondary | ICD-10-CM | POA: Diagnosis not present

## 2021-09-12 DIAGNOSIS — H43813 Vitreous degeneration, bilateral: Secondary | ICD-10-CM | POA: Diagnosis not present

## 2021-09-12 DIAGNOSIS — H353211 Exudative age-related macular degeneration, right eye, with active choroidal neovascularization: Secondary | ICD-10-CM | POA: Diagnosis not present

## 2021-09-15 DIAGNOSIS — E538 Deficiency of other specified B group vitamins: Secondary | ICD-10-CM | POA: Diagnosis not present

## 2021-10-10 DIAGNOSIS — E7800 Pure hypercholesterolemia, unspecified: Secondary | ICD-10-CM | POA: Diagnosis not present

## 2021-10-10 DIAGNOSIS — K5900 Constipation, unspecified: Secondary | ICD-10-CM | POA: Diagnosis not present

## 2021-10-10 DIAGNOSIS — Z6824 Body mass index (BMI) 24.0-24.9, adult: Secondary | ICD-10-CM | POA: Diagnosis not present

## 2021-10-17 DIAGNOSIS — D510 Vitamin B12 deficiency anemia due to intrinsic factor deficiency: Secondary | ICD-10-CM | POA: Diagnosis not present

## 2021-10-23 DIAGNOSIS — H43813 Vitreous degeneration, bilateral: Secondary | ICD-10-CM | POA: Diagnosis not present

## 2021-10-23 DIAGNOSIS — H353211 Exudative age-related macular degeneration, right eye, with active choroidal neovascularization: Secondary | ICD-10-CM | POA: Diagnosis not present

## 2021-10-23 DIAGNOSIS — H353122 Nonexudative age-related macular degeneration, left eye, intermediate dry stage: Secondary | ICD-10-CM | POA: Diagnosis not present

## 2021-11-07 DIAGNOSIS — E063 Autoimmune thyroiditis: Secondary | ICD-10-CM | POA: Diagnosis not present

## 2021-11-07 DIAGNOSIS — Z6824 Body mass index (BMI) 24.0-24.9, adult: Secondary | ICD-10-CM | POA: Diagnosis not present

## 2021-11-17 DIAGNOSIS — E538 Deficiency of other specified B group vitamins: Secondary | ICD-10-CM | POA: Diagnosis not present

## 2021-11-22 DIAGNOSIS — I83813 Varicose veins of bilateral lower extremities with pain: Secondary | ICD-10-CM | POA: Diagnosis not present

## 2021-11-22 DIAGNOSIS — I872 Venous insufficiency (chronic) (peripheral): Secondary | ICD-10-CM | POA: Diagnosis not present

## 2021-11-22 DIAGNOSIS — M7989 Other specified soft tissue disorders: Secondary | ICD-10-CM | POA: Diagnosis not present

## 2021-11-23 DIAGNOSIS — L900 Lichen sclerosus et atrophicus: Secondary | ICD-10-CM | POA: Diagnosis not present

## 2021-12-08 DIAGNOSIS — Z6823 Body mass index (BMI) 23.0-23.9, adult: Secondary | ICD-10-CM | POA: Diagnosis not present

## 2021-12-08 DIAGNOSIS — E063 Autoimmune thyroiditis: Secondary | ICD-10-CM | POA: Diagnosis not present

## 2021-12-19 DIAGNOSIS — H353211 Exudative age-related macular degeneration, right eye, with active choroidal neovascularization: Secondary | ICD-10-CM | POA: Diagnosis not present

## 2021-12-19 DIAGNOSIS — H26493 Other secondary cataract, bilateral: Secondary | ICD-10-CM | POA: Diagnosis not present

## 2021-12-19 DIAGNOSIS — H353122 Nonexudative age-related macular degeneration, left eye, intermediate dry stage: Secondary | ICD-10-CM | POA: Diagnosis not present

## 2021-12-19 DIAGNOSIS — H43813 Vitreous degeneration, bilateral: Secondary | ICD-10-CM | POA: Diagnosis not present

## 2021-12-20 DIAGNOSIS — E538 Deficiency of other specified B group vitamins: Secondary | ICD-10-CM | POA: Diagnosis not present

## 2022-01-02 DIAGNOSIS — R252 Cramp and spasm: Secondary | ICD-10-CM | POA: Diagnosis not present

## 2022-01-08 DIAGNOSIS — E7800 Pure hypercholesterolemia, unspecified: Secondary | ICD-10-CM | POA: Diagnosis not present

## 2022-01-08 DIAGNOSIS — Z6823 Body mass index (BMI) 23.0-23.9, adult: Secondary | ICD-10-CM | POA: Diagnosis not present

## 2022-01-10 DIAGNOSIS — R251 Tremor, unspecified: Secondary | ICD-10-CM | POA: Diagnosis not present

## 2022-01-10 DIAGNOSIS — G629 Polyneuropathy, unspecified: Secondary | ICD-10-CM | POA: Diagnosis not present

## 2022-01-10 DIAGNOSIS — E538 Deficiency of other specified B group vitamins: Secondary | ICD-10-CM | POA: Diagnosis not present

## 2022-01-12 DIAGNOSIS — Z23 Encounter for immunization: Secondary | ICD-10-CM | POA: Diagnosis not present

## 2022-01-16 ENCOUNTER — Encounter: Payer: Self-pay | Admitting: Neurology

## 2022-02-13 DIAGNOSIS — E063 Autoimmune thyroiditis: Secondary | ICD-10-CM | POA: Diagnosis not present

## 2022-02-13 DIAGNOSIS — Z6823 Body mass index (BMI) 23.0-23.9, adult: Secondary | ICD-10-CM | POA: Diagnosis not present

## 2022-02-19 DIAGNOSIS — E538 Deficiency of other specified B group vitamins: Secondary | ICD-10-CM | POA: Diagnosis not present

## 2022-02-20 DIAGNOSIS — H353211 Exudative age-related macular degeneration, right eye, with active choroidal neovascularization: Secondary | ICD-10-CM | POA: Diagnosis not present

## 2022-03-20 ENCOUNTER — Other Ambulatory Visit: Payer: Self-pay | Admitting: Obstetrics and Gynecology

## 2022-03-20 DIAGNOSIS — M810 Age-related osteoporosis without current pathological fracture: Secondary | ICD-10-CM

## 2022-03-20 DIAGNOSIS — L989 Disorder of the skin and subcutaneous tissue, unspecified: Secondary | ICD-10-CM | POA: Diagnosis not present

## 2022-03-22 DIAGNOSIS — D510 Vitamin B12 deficiency anemia due to intrinsic factor deficiency: Secondary | ICD-10-CM | POA: Diagnosis not present

## 2022-03-22 NOTE — Progress Notes (Signed)
Assessment/Plan:   Essential Tremor.  -This is evidenced by the symmetrical nature and longstanding hx of gradually getting worse.  We discussed nature and pathophysiology.  We discussed that this can continue to gradually get worse with time.  We discussed medications and nonmedicinal therapies.  She was given primidone by Dr. Delfina Redwood and is going to start that.  She thinks that she just wants to do the 50 mg, 1/2 po qhs and I think that is reasonable.  She may need an increase in the future and we discussed that she has plenty of room to increase in the future  -no evidence of a neurodegen process today   B12 deficiency with associated neuropathy  -on injections  Subjective:   Brittany Valenzuela was seen today in the movement disorders clinic for neurologic consultation at the request of Seward Carol, MD.  The consultation is for the evaluation of tremor.  Primary care notes indicate that patient thought she had had tremor for over 30 years, but felt she was getting worse.  When she saw her primary care in November, she was started on low-dose primidone.  She has not yet taken it.  She states she saw a neuro in [place] years ago for it and she didn't medication for it b/c she was told med would make her tired.    Tremor: Yes.     At rest or with activation?  both  When is it noted the most?  Used to be when in front of people (she was in [place] stress  Fam hx of tremor?  Yes.  , mother, maternal GM, daughter and son with tremor  Located where?  Bilateral UE; she is R hand dominant  Affected by caffeine:  doesn't drink any  Affected by alcohol:  no; drinks 2 glasses wine/month   Affected by stress:  Yes.    Affected by fatigue:  a little with physical fatigue  Spills soup if on spoon:  No.  Spills glass of liquid if full:  No.  Affects ADL's (tying shoes, brushing teeth, etc):  No.  Tremor inducing meds:  Yes.   naltrexone (on for weight loss and has been on for 3  months) - no change in tremor  Other Specific Symptoms:  Voice: no change Sleep: sleeps well  Vivid Dreams:  No.  Acting out dreams:  No. Wet Pillows: No. Postural symptoms:  No.  Falls?  Yes.   - 1 fall - got up when feet were still crossed and foot was asleep - this was about 1 year ago Bradykinesia symptoms: no bradykinesia noted Loss of smell:  No. Loss of taste:  No. Urinary Incontinence:  No., better after bladder repair with hyst Difficulty Swallowing:  No. Handwriting, micrographia: No. N/V:  No. Lightheaded:  No.  Syncope: No.   Neuroimaging of the brain has not previously been performed.    ALLERGIES:   Allergies  Allergen Reactions   Penicillins Hives and Nausea And Vomiting    Has patient had a PCN reaction causing immediate rash, facial/tongue/throat swelling, SOB or lightheadedness with hypotension: No Has patient had a PCN reaction causing severe rash involving mucus membranes or skin necrosis: No Has patient had a PCN reaction that required hospitalization No Has patient had a PCN reaction occurring within the last 10 years: No If all of the above answers are "NO", then may proceed with Cephalosporin use.   Sulfa Antibiotics Nausea And Vomiting and Rash   Morphine And Related Nausea Only  Reaction to Morphine, Oxycodone Tolerates Dilaudid, tolerates hydrocodone somewhat    CURRENT MEDICATIONS:  Current Outpatient Medications  Medication Instructions   Ascorbic Acid (VITA-C PO) 1 Dose, Oral, Daily   CALCIUM PO 1 Dose, Oral, Daily   Coenzyme Q10 (CO Q 10 PO) 1 Dose, Oral, Daily   Cyanocobalamin (B-12 PO) 1 Dose, Oral, Daily   ibandronate (BONIVA) 150 mg, Oral, Every 30 days   ibuprofen (ADVIL) 600 mg, Oral, Every 8 hours   Multiple Vitamin (MULTIVITAMIN WITH MINERALS) TABS tablet 1 tablet, Oral, Daily   Omega-3 Fatty Acids (FISH OIL PO) 1 Dose, Oral, Daily   primidone (MYSOLINE) 50 mg   sodium chloride (OCEAN) 0.65 % SOLN nasal spray 1 spray, Each  Nare, As needed   traMADol (ULTRAM) 50 mg, Oral, Every 6 hours PRN    Objective:   PHYSICAL EXAMINATION:    VITALS:   Vitals:   03/26/22 1300  BP: 122/78  Pulse: 72  SpO2: 98%  Weight: 159 lb (72.1 kg)    GEN:  The patient appears stated age and is in NAD. HEENT:  Normocephalic, atraumatic.  The mucous membranes are moist. The superficial temporal arteries are without ropiness or tenderness. CV:  RRR Lungs:  CTAB Neck/HEME:  There are no carotid bruits bilaterally.  Neurological examination:  Orientation: The patient is alert and oriented x3.  Cranial nerves: There is good facial symmetry.  Extraocular muscles are intact. The visual fields are full to confrontational testing. The speech is fluent and clear. Soft palate rises symmetrically and there is no tongue deviation. Hearing is intact to conversational tone. Sensation: Sensation is intact to light touch throughout (facial, trunk, extremities). Vibration is absent at the bilateral big toe but intact at the bilateral ankle. There is no extinction with double simultaneous stimulation.  Motor: Strength is 5/5 in the bilateral upper and lower extremities.   Shoulder shrug is equal and symmetric.  There is no pronator drift. Deep tendon reflexes: Deep tendon reflexes are 2+/4 at the bilateral biceps, triceps, brachioradialis, patella and achilles. Plantar responses are downgoing bilaterally.  Movement examination: Tone: There is nl tone in the bilateral upper extremities.  The tone in the lower extremities is nl.  Abnormal movements: there is no rest tremor.  There is bilateral postural tremor.  She does archimedes spirals well but has a mild tremor on the L.  she has no difficulty when asked to pour a full glass of water from one glass to another.  Coordination:  There is no decremation with RAM's, with any form of RAMS, including alternating supination and pronation of the forearm, hand opening and closing, finger taps, heel taps  and toe taps.  Gait and Station: The patient has no difficulty arising out of a deep-seated chair without the use of the hands. The patient's stride length is good.  She can ambulate in a tandem fashion without trouble.       Total time spent on today's visit was 45 minutes, including both face-to-face time and nonface-to-face time.  Time included that spent on review of records (prior notes available to me/labs/imaging if pertinent), discussing treatment and goals, answering patient's questions and coordinating care.  Cc:  Seward Carol, MD

## 2022-03-23 DIAGNOSIS — E7800 Pure hypercholesterolemia, unspecified: Secondary | ICD-10-CM | POA: Diagnosis not present

## 2022-03-23 DIAGNOSIS — Z6824 Body mass index (BMI) 24.0-24.9, adult: Secondary | ICD-10-CM | POA: Diagnosis not present

## 2022-03-26 ENCOUNTER — Ambulatory Visit (INDEPENDENT_AMBULATORY_CARE_PROVIDER_SITE_OTHER): Payer: Medicare Other | Admitting: Neurology

## 2022-03-26 ENCOUNTER — Encounter: Payer: Self-pay | Admitting: Neurology

## 2022-03-26 VITALS — BP 122/78 | HR 72 | Wt 159.0 lb

## 2022-03-26 DIAGNOSIS — G250 Essential tremor: Secondary | ICD-10-CM

## 2022-03-26 DIAGNOSIS — E538 Deficiency of other specified B group vitamins: Secondary | ICD-10-CM

## 2022-03-26 NOTE — Patient Instructions (Signed)
It was good to see you today!  Essential Tremor A tremor is trembling or shaking that a person cannot control. Most tremors affect the hands or arms. Tremors can also affect the head, vocal cords, legs, and other parts of the body. Essential tremor is a tremor without a known cause. Usually, it occurs while a person is trying to perform an action. It tends to get worse gradually as a person ages. What are the causes? The cause of this condition is not known, but it often runs in families. What increases the risk? You are more likely to develop this condition if: You have a family member with essential tremor. You are 60 years of age or older. What are the signs or symptoms? The main sign of a tremor is a rhythmic shaking of certain parts of your body that is uncontrolled and unintentional. You may: Have difficulty eating with a spoon or fork. Have difficulty writing. Nod your head up and down or side to side. Have a quivering voice. The shaking may: Get worse over time. Come and go. Be more noticeable on one side of your body. Get worse due to stress, tiredness (fatigue), caffeine, and extreme heat or cold. How is this diagnosed? This condition may be diagnosed based on: Your symptoms and medical history. A physical exam. There is no single test to diagnose an essential tremor. However, your health care provider may order tests to rule out other causes of your condition. These may include: Blood and urine tests. Imaging studies of your brain, such as a CT scan or MRI. How is this treated? Treatment for essential tremor depends on the severity of the condition. Mild tremors may not need treatment if they do not affect your day-to-day life. Severe tremors may need to be treated using one or more of the following options: Medicines. Injections of a substance called botulinum toxin. Procedures such as deep brain stimulation (DBS) implantation or MRI-guided ultrasound  treatment. Lifestyle changes. Occupational or physical therapy. Follow these instructions at home: Lifestyle  Do not use any products that contain nicotine or tobacco. These products include cigarettes, chewing tobacco, and vaping devices, such as e-cigarettes. If you need help quitting, ask your health care provider. Limit your caffeine intake as told by your health care provider. Try to get 8 hours of sleep each night. Find ways to manage your stress that fit your lifestyle and personality. Consider trying meditation or yoga. Try to anticipate stressful situations and allow extra time to manage them. If you are struggling emotionally with the effects of your tremor, consider working with a mental health provider. General instructions Take over-the-counter and prescription medicines only as told by your health care provider. Avoid extreme heat and extreme cold. Keep all follow-up visits. This is important. Visits may include physical therapy visits. Where to find more information [employer] of Neurological Disorders and Stroke: [employer] Contact a health care provider if: You experience any changes in the location or intensity of your tremors. You start having a tremor after starting a new medicine. You have a tremor with other symptoms, such as: Numbness. Tingling. Pain. Weakness. Your tremor gets worse. Your tremor interferes with your daily life. You feel down, blue, or sad for at least 2 weeks in a row. Worrying about your tremor and what other people think about you interferes with your everyday life functions, including relationships, work, or school. Summary Essential tremor is a tremor without a known cause. Usually, it occurs when you are trying  to perform an action. You are more likely to develop this condition if you have a family member with essential tremor. The main sign of a tremor is a rhythmic shaking of certain parts of your body that is  uncontrolled and unintentional. Treatment for essential tremor depends on the severity of the condition. This information is not intended to replace advice given to you by your health care provider. Make sure you discuss any questions you have with your health care provider. Document Revised: 12/09/2020 Document Reviewed: 12/09/2020 Elsevier Patient Education  [place].

## 2022-04-27 DIAGNOSIS — Z6823 Body mass index (BMI) 23.0-23.9, adult: Secondary | ICD-10-CM | POA: Diagnosis not present

## 2022-04-27 DIAGNOSIS — E063 Autoimmune thyroiditis: Secondary | ICD-10-CM | POA: Diagnosis not present

## 2022-04-30 ENCOUNTER — Other Ambulatory Visit: Payer: Medicare Other

## 2022-05-07 DIAGNOSIS — M810 Age-related osteoporosis without current pathological fracture: Secondary | ICD-10-CM | POA: Diagnosis not present

## 2022-05-07 DIAGNOSIS — Z1231 Encounter for screening mammogram for malignant neoplasm of breast: Secondary | ICD-10-CM | POA: Diagnosis not present

## 2022-05-07 DIAGNOSIS — Z01419 Encounter for gynecological examination (general) (routine) without abnormal findings: Secondary | ICD-10-CM | POA: Diagnosis not present

## 2022-05-07 DIAGNOSIS — Z6825 Body mass index (BMI) 25.0-25.9, adult: Secondary | ICD-10-CM | POA: Diagnosis not present

## 2022-05-07 DIAGNOSIS — Z1211 Encounter for screening for malignant neoplasm of colon: Secondary | ICD-10-CM | POA: Diagnosis not present

## 2022-05-08 DIAGNOSIS — H353211 Exudative age-related macular degeneration, right eye, with active choroidal neovascularization: Secondary | ICD-10-CM | POA: Diagnosis not present

## 2022-05-08 DIAGNOSIS — H353122 Nonexudative age-related macular degeneration, left eye, intermediate dry stage: Secondary | ICD-10-CM | POA: Diagnosis not present

## 2022-05-08 DIAGNOSIS — H43813 Vitreous degeneration, bilateral: Secondary | ICD-10-CM | POA: Diagnosis not present

## 2022-05-15 ENCOUNTER — Other Ambulatory Visit: Payer: Medicare Other

## 2022-06-05 DIAGNOSIS — E7800 Pure hypercholesterolemia, unspecified: Secondary | ICD-10-CM | POA: Diagnosis not present

## 2022-06-05 DIAGNOSIS — Z6823 Body mass index (BMI) 23.0-23.9, adult: Secondary | ICD-10-CM | POA: Diagnosis not present

## 2022-06-05 DIAGNOSIS — E063 Autoimmune thyroiditis: Secondary | ICD-10-CM | POA: Diagnosis not present

## 2022-06-19 DIAGNOSIS — E538 Deficiency of other specified B group vitamins: Secondary | ICD-10-CM | POA: Diagnosis not present

## 2022-06-28 DIAGNOSIS — H26493 Other secondary cataract, bilateral: Secondary | ICD-10-CM | POA: Diagnosis not present

## 2022-07-04 DIAGNOSIS — Z79899 Other long term (current) drug therapy: Secondary | ICD-10-CM | POA: Diagnosis not present

## 2022-07-04 DIAGNOSIS — E559 Vitamin D deficiency, unspecified: Secondary | ICD-10-CM | POA: Diagnosis not present

## 2022-07-04 DIAGNOSIS — G250 Essential tremor: Secondary | ICD-10-CM | POA: Diagnosis not present

## 2022-07-04 DIAGNOSIS — Z1331 Encounter for screening for depression: Secondary | ICD-10-CM | POA: Diagnosis not present

## 2022-07-04 DIAGNOSIS — Z23 Encounter for immunization: Secondary | ICD-10-CM | POA: Diagnosis not present

## 2022-07-04 DIAGNOSIS — M810 Age-related osteoporosis without current pathological fracture: Secondary | ICD-10-CM | POA: Diagnosis not present

## 2022-07-04 DIAGNOSIS — Z0000 Encounter for general adult medical examination without abnormal findings: Secondary | ICD-10-CM | POA: Diagnosis not present

## 2022-07-04 DIAGNOSIS — E7800 Pure hypercholesterolemia, unspecified: Secondary | ICD-10-CM | POA: Diagnosis not present

## 2022-07-04 DIAGNOSIS — Z5181 Encounter for therapeutic drug level monitoring: Secondary | ICD-10-CM | POA: Diagnosis not present

## 2022-07-04 DIAGNOSIS — E538 Deficiency of other specified B group vitamins: Secondary | ICD-10-CM | POA: Diagnosis not present

## 2022-07-11 DIAGNOSIS — E7800 Pure hypercholesterolemia, unspecified: Secondary | ICD-10-CM | POA: Diagnosis not present

## 2022-07-11 DIAGNOSIS — Z6823 Body mass index (BMI) 23.0-23.9, adult: Secondary | ICD-10-CM | POA: Diagnosis not present

## 2022-07-11 DIAGNOSIS — E063 Autoimmune thyroiditis: Secondary | ICD-10-CM | POA: Diagnosis not present

## 2022-08-06 DIAGNOSIS — H353211 Exudative age-related macular degeneration, right eye, with active choroidal neovascularization: Secondary | ICD-10-CM | POA: Diagnosis not present

## 2022-08-06 DIAGNOSIS — H43813 Vitreous degeneration, bilateral: Secondary | ICD-10-CM | POA: Diagnosis not present

## 2022-08-08 DIAGNOSIS — Z6823 Body mass index (BMI) 23.0-23.9, adult: Secondary | ICD-10-CM | POA: Diagnosis not present

## 2022-08-08 DIAGNOSIS — E063 Autoimmune thyroiditis: Secondary | ICD-10-CM | POA: Diagnosis not present

## 2022-08-20 DIAGNOSIS — Z23 Encounter for immunization: Secondary | ICD-10-CM | POA: Diagnosis not present

## 2022-09-03 DIAGNOSIS — K7689 Other specified diseases of liver: Secondary | ICD-10-CM | POA: Diagnosis not present

## 2022-09-03 DIAGNOSIS — R945 Abnormal results of liver function studies: Secondary | ICD-10-CM | POA: Diagnosis not present

## 2022-09-03 DIAGNOSIS — R197 Diarrhea, unspecified: Secondary | ICD-10-CM | POA: Diagnosis not present

## 2022-09-03 DIAGNOSIS — I4581 Long QT syndrome: Secondary | ICD-10-CM | POA: Diagnosis not present

## 2022-09-03 DIAGNOSIS — R112 Nausea with vomiting, unspecified: Secondary | ICD-10-CM | POA: Diagnosis not present

## 2022-09-03 DIAGNOSIS — N200 Calculus of kidney: Secondary | ICD-10-CM | POA: Diagnosis not present

## 2022-09-03 DIAGNOSIS — K8689 Other specified diseases of pancreas: Secondary | ICD-10-CM | POA: Diagnosis not present

## 2022-09-03 DIAGNOSIS — K449 Diaphragmatic hernia without obstruction or gangrene: Secondary | ICD-10-CM | POA: Diagnosis not present

## 2022-09-03 DIAGNOSIS — Z743 Need for continuous supervision: Secondary | ICD-10-CM | POA: Diagnosis not present

## 2022-09-03 DIAGNOSIS — R9389 Abnormal findings on diagnostic imaging of other specified body structures: Secondary | ICD-10-CM | POA: Diagnosis not present

## 2022-09-03 DIAGNOSIS — R1031 Right lower quadrant pain: Secondary | ICD-10-CM | POA: Diagnosis not present

## 2022-09-03 DIAGNOSIS — K5790 Diverticulosis of intestine, part unspecified, without perforation or abscess without bleeding: Secondary | ICD-10-CM | POA: Diagnosis not present

## 2022-09-03 DIAGNOSIS — Z9049 Acquired absence of other specified parts of digestive tract: Secondary | ICD-10-CM | POA: Diagnosis not present

## 2022-09-03 DIAGNOSIS — R9431 Abnormal electrocardiogram [ECG] [EKG]: Secondary | ICD-10-CM | POA: Diagnosis not present

## 2022-09-04 DIAGNOSIS — N200 Calculus of kidney: Secondary | ICD-10-CM | POA: Diagnosis not present

## 2022-09-04 DIAGNOSIS — Z9049 Acquired absence of other specified parts of digestive tract: Secondary | ICD-10-CM | POA: Diagnosis not present

## 2022-09-04 DIAGNOSIS — R9389 Abnormal findings on diagnostic imaging of other specified body structures: Secondary | ICD-10-CM | POA: Diagnosis not present

## 2022-09-04 DIAGNOSIS — R1031 Right lower quadrant pain: Secondary | ICD-10-CM | POA: Diagnosis not present

## 2022-09-04 DIAGNOSIS — K5790 Diverticulosis of intestine, part unspecified, without perforation or abscess without bleeding: Secondary | ICD-10-CM | POA: Diagnosis not present

## 2022-09-04 DIAGNOSIS — K449 Diaphragmatic hernia without obstruction or gangrene: Secondary | ICD-10-CM | POA: Diagnosis not present

## 2022-09-04 DIAGNOSIS — K8689 Other specified diseases of pancreas: Secondary | ICD-10-CM | POA: Diagnosis not present

## 2022-09-04 DIAGNOSIS — K7689 Other specified diseases of liver: Secondary | ICD-10-CM | POA: Diagnosis not present

## 2022-09-04 DIAGNOSIS — R112 Nausea with vomiting, unspecified: Secondary | ICD-10-CM | POA: Diagnosis not present

## 2022-09-18 DIAGNOSIS — E538 Deficiency of other specified B group vitamins: Secondary | ICD-10-CM | POA: Diagnosis not present

## 2022-09-20 ENCOUNTER — Other Ambulatory Visit (HOSPITAL_BASED_OUTPATIENT_CLINIC_OR_DEPARTMENT_OTHER): Payer: Self-pay | Admitting: Internal Medicine

## 2022-09-20 DIAGNOSIS — E7800 Pure hypercholesterolemia, unspecified: Secondary | ICD-10-CM | POA: Diagnosis not present

## 2022-09-20 DIAGNOSIS — I700 Atherosclerosis of aorta: Secondary | ICD-10-CM | POA: Diagnosis not present

## 2022-09-20 DIAGNOSIS — K529 Noninfective gastroenteritis and colitis, unspecified: Secondary | ICD-10-CM | POA: Diagnosis not present

## 2022-09-26 ENCOUNTER — Telehealth (HOSPITAL_BASED_OUTPATIENT_CLINIC_OR_DEPARTMENT_OTHER): Payer: Self-pay

## 2022-10-11 DIAGNOSIS — E063 Autoimmune thyroiditis: Secondary | ICD-10-CM | POA: Diagnosis not present

## 2022-10-11 DIAGNOSIS — Z6824 Body mass index (BMI) 24.0-24.9, adult: Secondary | ICD-10-CM | POA: Diagnosis not present

## 2022-10-23 ENCOUNTER — Ambulatory Visit (HOSPITAL_COMMUNITY)
Admission: RE | Admit: 2022-10-23 | Discharge: 2022-10-23 | Disposition: A | Discharge status: Home / Self Care | Payer: Medicare Other | Source: Ambulatory Visit | Attending: Internal Medicine | Admitting: Internal Medicine

## 2022-10-23 DIAGNOSIS — E7800 Pure hypercholesterolemia, unspecified: Secondary | ICD-10-CM | POA: Insufficient documentation

## 2022-11-01 DIAGNOSIS — D510 Vitamin B12 deficiency anemia due to intrinsic factor deficiency: Secondary | ICD-10-CM | POA: Diagnosis not present

## 2022-11-06 DIAGNOSIS — L821 Other seborrheic keratosis: Secondary | ICD-10-CM | POA: Diagnosis not present

## 2022-11-06 DIAGNOSIS — D2261 Melanocytic nevi of right upper limb, including shoulder: Secondary | ICD-10-CM | POA: Diagnosis not present

## 2022-11-06 DIAGNOSIS — L820 Inflamed seborrheic keratosis: Secondary | ICD-10-CM | POA: Diagnosis not present

## 2022-11-06 DIAGNOSIS — D485 Neoplasm of uncertain behavior of skin: Secondary | ICD-10-CM | POA: Diagnosis not present

## 2022-11-06 DIAGNOSIS — D1801 Hemangioma of skin and subcutaneous tissue: Secondary | ICD-10-CM | POA: Diagnosis not present

## 2022-11-06 DIAGNOSIS — D225 Melanocytic nevi of trunk: Secondary | ICD-10-CM | POA: Diagnosis not present

## 2022-11-12 DIAGNOSIS — H353211 Exudative age-related macular degeneration, right eye, with active choroidal neovascularization: Secondary | ICD-10-CM | POA: Diagnosis not present

## 2022-11-12 DIAGNOSIS — H353122 Nonexudative age-related macular degeneration, left eye, intermediate dry stage: Secondary | ICD-10-CM | POA: Diagnosis not present

## 2022-11-12 DIAGNOSIS — Z961 Presence of intraocular lens: Secondary | ICD-10-CM | POA: Diagnosis not present

## 2022-11-12 DIAGNOSIS — H43813 Vitreous degeneration, bilateral: Secondary | ICD-10-CM | POA: Diagnosis not present

## 2022-12-17 DIAGNOSIS — Z23 Encounter for immunization: Secondary | ICD-10-CM | POA: Diagnosis not present

## 2022-12-17 DIAGNOSIS — D510 Vitamin B12 deficiency anemia due to intrinsic factor deficiency: Secondary | ICD-10-CM | POA: Diagnosis not present

## 2022-12-31 DIAGNOSIS — I700 Atherosclerosis of aorta: Secondary | ICD-10-CM | POA: Diagnosis not present

## 2022-12-31 DIAGNOSIS — M810 Age-related osteoporosis without current pathological fracture: Secondary | ICD-10-CM | POA: Diagnosis not present

## 2022-12-31 DIAGNOSIS — E7800 Pure hypercholesterolemia, unspecified: Secondary | ICD-10-CM | POA: Diagnosis not present

## 2022-12-31 DIAGNOSIS — K7689 Other specified diseases of liver: Secondary | ICD-10-CM | POA: Diagnosis not present

## 2023-01-17 DIAGNOSIS — E538 Deficiency of other specified B group vitamins: Secondary | ICD-10-CM | POA: Diagnosis not present

## 2023-02-14 ENCOUNTER — Ambulatory Visit
Admission: RE | Admit: 2023-02-14 | Discharge: 2023-02-14 | Disposition: A | Discharge status: Home / Self Care | Payer: 59 | Source: Ambulatory Visit | Attending: Obstetrics and Gynecology | Admitting: Obstetrics and Gynecology

## 2023-02-14 DIAGNOSIS — M8588 Other specified disorders of bone density and structure, other site: Secondary | ICD-10-CM | POA: Diagnosis not present

## 2023-02-14 DIAGNOSIS — N958 Other specified menopausal and perimenopausal disorders: Secondary | ICD-10-CM | POA: Diagnosis not present

## 2023-02-14 DIAGNOSIS — E2839 Other primary ovarian failure: Secondary | ICD-10-CM | POA: Diagnosis not present

## 2023-02-14 DIAGNOSIS — M810 Age-related osteoporosis without current pathological fracture: Secondary | ICD-10-CM | POA: Diagnosis not present

## 2023-04-12 ENCOUNTER — Other Ambulatory Visit: Payer: Self-pay | Admitting: Obstetrics and Gynecology

## 2023-04-12 DIAGNOSIS — Z1231 Encounter for screening mammogram for malignant neoplasm of breast: Secondary | ICD-10-CM

## 2023-05-22 ENCOUNTER — Ambulatory Visit: Payer: Medicare Other

## 2023-06-06 ENCOUNTER — Ambulatory Visit
Admission: RE | Admit: 2023-06-06 | Discharge: 2023-06-06 | Disposition: A | Discharge status: Home / Self Care | Source: Ambulatory Visit | Attending: Obstetrics and Gynecology | Admitting: Obstetrics and Gynecology

## 2023-06-06 DIAGNOSIS — Z1231 Encounter for screening mammogram for malignant neoplasm of breast: Secondary | ICD-10-CM
